# Patient Record
Sex: Female | Born: 1990 | Race: White | Hispanic: No | Marital: Married | Smoking: Never smoker
Health system: Southern US, Community
[De-identification: ages and names within clinical notes are randomized; demographics above are authoritative.]

## PROBLEM LIST (undated history)

## (undated) DIAGNOSIS — R51 Headache: Secondary | ICD-10-CM

## (undated) DIAGNOSIS — G43909 Migraine, unspecified, not intractable, without status migrainosus: Secondary | ICD-10-CM

## (undated) DIAGNOSIS — B019 Varicella without complication: Secondary | ICD-10-CM

## (undated) DIAGNOSIS — F419 Anxiety disorder, unspecified: Secondary | ICD-10-CM

## (undated) DIAGNOSIS — R519 Headache, unspecified: Secondary | ICD-10-CM

## (undated) DIAGNOSIS — E282 Polycystic ovarian syndrome: Secondary | ICD-10-CM

## (undated) HISTORY — DX: Migraine, unspecified, not intractable, without status migrainosus: G43.909

## (undated) HISTORY — DX: Anxiety disorder, unspecified: F41.9

## (undated) HISTORY — DX: Headache, unspecified: R51.9

## (undated) HISTORY — PX: OTHER SURGICAL HISTORY: SHX169

## (undated) HISTORY — DX: Varicella without complication: B01.9

## (undated) HISTORY — DX: Headache: R51

## (undated) HISTORY — PX: KNEE SURGERY: SHX244

---
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
## 2016-04-19 ENCOUNTER — Emergency Department (HOSPITAL_COMMUNITY)
Admission: EM | Admit: 2016-04-19 | Discharge: 2016-04-19 | Disposition: A | Discharge status: Home / Self Care | Payer: Self-pay | Attending: Emergency Medicine | Admitting: Emergency Medicine

## 2016-04-19 ENCOUNTER — Encounter (HOSPITAL_COMMUNITY): Payer: Self-pay

## 2016-04-19 DIAGNOSIS — L0291 Cutaneous abscess, unspecified: Secondary | ICD-10-CM

## 2016-04-19 DIAGNOSIS — N764 Abscess of vulva: Secondary | ICD-10-CM | POA: Insufficient documentation

## 2016-04-19 MED ORDER — CEPHALEXIN 500 MG PO CAPS: 500.0000 mg | ORAL_CAPSULE | Freq: Four times a day (QID) | ORAL | 0 refills | Status: DC

## 2016-04-19 MED ORDER — LIDOCAINE HCL (PF) 1 % IJ SOLN
5.0000 mL | Freq: Once | INTRAMUSCULAR | Status: AC
Start: 2016-04-19 — End: 2016-04-19
  Administered 2016-04-19: 5 mL
  Filled 2016-04-19: qty 5

## 2016-04-19 NOTE — Discharge Instructions (Signed)
Medications: Keflex  Treatment: Take Keflex 4 times daily for 5 days. Make sure to finish all his medication. Leave the dressing applied on until later this evening. Wash the area with warm soapy water, dry, andAnd apply new dressing.  Follow-up: Please return in 2 days for wound recheck. Please return sooner if you develop any in, redness, swelling, streaking from the area. Please follow-up with the [facility] outpatient clinic for further evaluation if your left lower quadrant pain recurs.

## 2016-04-19 NOTE — ED Notes (Signed)
Pt states she understands instructions and will follow up. Home stable with boyfriend and steady gait.

## 2016-04-19 NOTE — ED Triage Notes (Signed)
Patient complains of small hard lump to left groin x 3 days. States that the area tender to touch, no drainage

## 2016-04-19 NOTE — ED Provider Notes (Signed)
[facility]-EMERGENCY DEPT Provider Note   CSN: [id number] Arrival date & time: 04/19/16  4098  By signing my name below, I, Soijett Blue, attest that this documentation has been prepared under the direction and in the presence of Buel Ream, PA-C Electronically Signed: Soijett Blue, ED Scribe. 04/19/16. 1:40 PM.  History   Chief Complaint No chief complaint on file.   HPI Samantha Shea is a 26 y.o. female who presents to the Emergency Department complaining of painful lump to left groin area onset 3 days ago. Pt reports associated redness to affected area. She also reports intermittent LLQ pain x 1 month, which she does not have today. It has been around the same time over her cycle. Pt has tried warm compresses and ice without medications with mild relief of her symptoms. Pt notes that her left groin pain is worsened with ambulation. Pt denies fever, drainage, vaginal discharge, dysuria, constipation, diarrhea, and any other symptoms.      The history is provided by the patient. No language interpreter was used.    History reviewed. No pertinent past medical history.  There are no active problems to display for this patient.   History reviewed. No pertinent surgical history.  OB History    No data available       Home Medications    Prior to Admission medications   Medication Sig Start Date End Date Taking? Authorizing Provider  cephALEXin (KEFLEX) 500 MG capsule Take 1 capsule (500 mg total) by mouth 4 (four) times daily. 04/19/16   Emi Holes, PA-C    Family History No family history on file.  Social History Social History  Substance Use Topics  . Smoking status: Not on file  . Smokeless tobacco: Not on file  . Alcohol use Not on file     Allergies   Codeine   Review of Systems Review of Systems  Constitutional: Negative for fever.  Gastrointestinal: Positive for abdominal pain (LLQ). Negative for constipation and diarrhea.  Genitourinary: Negative  for dysuria and vaginal discharge.  Skin: Positive for color change (redness to affected area).       "lump" to left groin area without drainage.    Physical Exam Updated Vital Signs BP 122/65 (BP Location: Right Arm)   Pulse 64   Temp 98 F (36.7 C) (Oral)   Resp 16   SpO2 100%   Physical Exam  Constitutional: She appears well-developed and well-nourished. No distress.  HENT:  Head: Normocephalic and atraumatic.  Mouth/Throat: Oropharynx is clear and moist. No oropharyngeal exudate.  Eyes: Conjunctivae are normal. Pupils are equal, round, and reactive to light. Right eye exhibits no discharge. Left eye exhibits no discharge. No scleral icterus.  Neck: Normal range of motion. Neck supple. No thyromegaly present.  Cardiovascular: Normal rate, regular rhythm, normal heart sounds and intact distal pulses.  Exam reveals no gallop and no friction rub.   No murmur heard. Pulmonary/Chest: Effort normal and breath sounds normal. No stridor. No respiratory distress. She has no wheezes. She has no rales.  Abdominal: Soft. Bowel sounds are normal. She exhibits no distension. There is no tenderness. There is no rebound and no guarding.  Genitourinary:  Genitourinary Comments: Scribe chaperone present for exam. 3 cm area of redness and induration, medial to left inguinal region.   Musculoskeletal: She exhibits no edema.  Lymphadenopathy:    She has no cervical adenopathy.  Neurological: She is alert. Coordination normal.  Skin: Skin is warm and dry. No rash noted. She  is not diaphoretic. No pallor.  Psychiatric: She has a normal mood and affect.  Nursing note and vitals reviewed.   ED Treatments / Results  DIAGNOSTIC STUDIES: Oxygen Saturation is 99% on RA, nl by my interpretation.    COORDINATION OF CARE: 10:26 AM Discussed treatment plan with pt at bedside which includes bedside US, I&D, abx Rx, and pt agreed to plan.   Procedures .Marland Kitchen.Incision and Drainage Date/Time: 04/19/2016 11:14  AM Performed by: Emi HolesLAW, Caedan Sumler M Authorized by: Emi HolesLAW, Birney Belshe M   Consent:    Consent obtained:  Verbal   Consent given by:  Patient   Risks discussed:  Incomplete drainage, pain and infection   Alternatives discussed:  Alternative treatment Location:    Type:  Abscess   Size:  3 cm   Location:  Anogenital   Anogenital location: medial to left inguinal region. Pre-procedure details:    Skin preparation:  Betadine Anesthesia (see MAR for exact dosages):    Anesthesia method:  Local infiltration   Local anesthetic:  Lidocaine 1% w/o epi (1 cc used) Procedure type:    Complexity:  Simple Procedure details:    Needle aspiration: no     Incision types:  Single straight   Incision depth:  Dermal   Scalpel blade:  11   Wound management:  Probed and deloculated and irrigated with saline   Drainage:  Bloody and purulent   Drainage amount:  Moderate   Wound treatment:  Wound left open   Packing materials:  None Post-procedure details:    Patient tolerance of procedure:  Tolerated well, no immediate complications     (including critical care time)  Medications Ordered in ED Medications  lidocaine (PF) (XYLOCAINE) 1 % injection 5 mL (5 mLs Infiltration Given 04/19/16 1143)     Initial Impression / Assessment and Plan / ED Course  I have reviewed the triage vital signs and the nursing notes.   Patient with skin abscess. Incision and drainage performed in the ED today. Abscess was not large enough to warrant packing or drain placement. Wound recheck in 2 days. Supportive care discussed.  Pt sent home with keflex Rx.  Considering no abdominal pain or tenderness at this time, no further workup today indicated. [facility] possible ovarian cyst.  Strict return precautions given. Patient understands and agrees with plan. Patient vitals stable throughout ED course and discharged in satisfactory condition.  Final Clinical  Impressions(s) / ED Diagnoses   Final diagnoses:  Abscess    New Prescriptions Discharge Medication List as of 04/19/2016 11:37 AM    START taking these medications   Details  cephALEXin (KEFLEX) 500 MG capsule Take 1 capsule (500 mg total) by mouth 4 (four) times daily., Starting Tue 04/19/2016, Print       I personally performed the services described in this documentation, which was scribed in my presence. The recorded information has been reviewed and is accurate.     Emi Holeslexandra M Toinette Lackie, PA-C 04/19/16 1341    Donnetta HutchingBrian Cook, MD 04/21/16 1556

## 2016-06-17 ENCOUNTER — Ambulatory Visit (INDEPENDENT_AMBULATORY_CARE_PROVIDER_SITE_OTHER): Payer: Self-pay | Admitting: Obstetrics & Gynecology

## 2016-06-17 ENCOUNTER — Encounter: Payer: Self-pay | Admitting: Obstetrics & Gynecology

## 2016-06-17 VITALS — BP 134/82 | HR 70 | Ht 66.0 in | Wt 176.8 lb

## 2016-06-17 DIAGNOSIS — R102 Pelvic and perineal pain: Secondary | ICD-10-CM

## 2016-06-17 MED ORDER — LEVONORGEST-ETH ESTRAD 91-DAY 0.1-0.02 & 0.01 MG PO TABS: 1.0000 | ORAL_TABLET | Freq: Every day | ORAL | 4 refills | Status: DC

## 2016-06-17 MED ORDER — IBUPROFEN 800 MG PO TABS: 800.0000 mg | ORAL_TABLET | Freq: Three times a day (TID) | ORAL | 1 refills | Status: DC | PRN

## 2016-06-17 NOTE — Progress Notes (Signed)
   Subjective:    Patient ID: Samantha Shea, female    DOB: 1990-12-08, 26 y.o.   MRN: [id number]  HPI 26 yo SW G0 here with new onset pelvic pain, worse before and during her periods. A week after her period, it is better but then cyclicly gets worse. This started 12/17 and is getting worse. She has tried 400 mg IBU and ice packs. She denies dyspareunia.   Review of Systems Uses condoms She tried OCPs for about 3 months. 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

## 2016-06-20 LAB — GC/CHLAMYDIA PROBE AMP (~~LOC~~) NOT AT ARMC
Chlamydia: NEGATIVE
Neisseria Gonorrhea: NEGATIVE

## 2017-08-15 LAB — HM PAP SMEAR

## 2017-11-22 ENCOUNTER — Emergency Department (HOSPITAL_COMMUNITY): Payer: BC Managed Care – PPO

## 2017-11-22 ENCOUNTER — Encounter (HOSPITAL_COMMUNITY): Payer: Self-pay | Admitting: Emergency Medicine

## 2017-11-22 ENCOUNTER — Emergency Department (HOSPITAL_COMMUNITY)
Admission: EM | Admit: 2017-11-22 | Discharge: 2017-11-23 | Disposition: A | Discharge status: Home / Self Care | Payer: BC Managed Care – PPO | Attending: Emergency Medicine | Admitting: Emergency Medicine

## 2017-11-22 ENCOUNTER — Other Ambulatory Visit: Payer: Self-pay

## 2017-11-22 DIAGNOSIS — R112 Nausea with vomiting, unspecified: Secondary | ICD-10-CM | POA: Diagnosis not present

## 2017-11-22 DIAGNOSIS — Y9389 Activity, other specified: Secondary | ICD-10-CM | POA: Diagnosis not present

## 2017-11-22 DIAGNOSIS — Z79899 Other long term (current) drug therapy: Secondary | ICD-10-CM | POA: Insufficient documentation

## 2017-11-22 DIAGNOSIS — Y999 Unspecified external cause status: Secondary | ICD-10-CM | POA: Diagnosis not present

## 2017-11-22 DIAGNOSIS — S0990XA Unspecified injury of head, initial encounter: Secondary | ICD-10-CM | POA: Diagnosis present

## 2017-11-22 DIAGNOSIS — Y92410 Unspecified street and highway as the place of occurrence of the external cause: Secondary | ICD-10-CM | POA: Diagnosis not present

## 2017-11-22 LAB — BASIC METABOLIC PANEL
Anion gap: 12 (ref 5–15)
BUN: 9 mg/dL (ref 6–20)
CALCIUM: 9.1 mg/dL (ref 8.9–10.3)
CO2: 21 mmol/L — ABNORMAL LOW (ref 22–32)
CREATININE: 0.7 mg/dL (ref 0.44–1.00)
Chloride: 105 mmol/L (ref 98–111)
GFR calc Af Amer: >60 mL/min (ref >60)
GLUCOSE: 118 mg/dL — AB (ref 70–99)
Potassium: 3.8 mmol/L (ref 3.5–5.1)
SODIUM: 138 mmol/L (ref 135–145)

## 2017-11-22 LAB — CBC WITH DIFFERENTIAL/PLATELET
Abs Immature Granulocytes: 0 10*3/uL (ref 0.0–0.1)
BASOS ABS: 0 10*3/uL (ref 0.0–0.1)
BASOS PCT: 0 %
EOS ABS: 0.1 10*3/uL (ref 0.0–0.7)
Eosinophils Relative: 1 %
HCT: 41.2 % (ref 36.0–46.0)
Hemoglobin: 13.9 g/dL (ref 12.0–15.0)
Immature Granulocytes: 0 %
Lymphocytes Relative: 13 %
Lymphs Abs: 1.2 10*3/uL (ref 0.7–4.0)
MCH: 31.4 pg (ref 26.0–34.0)
MCHC: 33.7 g/dL (ref 30.0–36.0)
MCV: 93.2 fL (ref 78.0–100.0)
MONOS PCT: 10 %
Monocytes Absolute: 0.9 10*3/uL (ref 0.1–1.0)
NEUTROS PCT: 76 %
Neutro Abs: 6.9 10*3/uL (ref 1.7–7.7)
PLATELETS: 229 10*3/uL (ref 150–400)
RBC: 4.42 MIL/uL (ref 3.87–5.11)
RDW: 11.9 % (ref 11.5–15.5)
WBC: 9.2 10*3/uL (ref 4.0–10.5)

## 2017-11-22 LAB — I-STAT BETA HCG BLOOD, ED (MC, WL, AP ONLY): I-stat hCG, quantitative: <5 m[IU]/mL (ref <5)

## 2017-11-22 MED ORDER — ONDANSETRON 4 MG PO TBDP
4.0000 mg | ORAL_TABLET | Freq: Once | ORAL | Status: AC
  Administered 2017-11-22: 4 mg via ORAL
  Filled 2017-11-22: qty 1

## 2017-11-22 MED ORDER — MORPHINE SULFATE (PF) 2 MG/ML IV SOLN
2.0000 mg | Freq: Once | INTRAVENOUS | Status: AC
  Administered 2017-11-22: 2 mg via INTRAVENOUS
  Filled 2017-11-22: qty 1

## 2017-11-22 MED ORDER — SODIUM CHLORIDE 0.9 % IV BOLUS
1000.0000 mL | Freq: Once | INTRAVENOUS | Status: AC
  Administered 2017-11-22: 1000 mL via INTRAVENOUS
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
## 2017-11-22 NOTE — ED Notes (Signed)
ED Provider at bedside. 

## 2017-11-23 MED ORDER — KETOROLAC TROMETHAMINE 15 MG/ML IJ SOLN
15.0000 mg | Freq: Once | INTRAMUSCULAR | Status: AC
  Administered 2017-11-23: 15 mg via INTRAVENOUS
  Filled 2017-11-23: qty 1

## 2017-11-23 MED ORDER — ONDANSETRON 4 MG PO TBDP: 4.0000 mg | ORAL_TABLET | Freq: Three times a day (TID) | ORAL | 0 refills | Status: DC | PRN

## 2017-11-23 NOTE — Discharge Instructions (Addendum)
Please return to the Emergency Department for any new or worsening symptoms or if your symptoms do not improve. Please be sure to follow up with your Primary Care Physician as soon as possible regarding your visit today. If you do not have a Primary Doctor please use the resources below to establish one. It is possible that you are experiencing a concussion today.  Please follow-up with the concussion clinic listed on your after visit summary for further evaluation.  Please get plenty of rest and avoid further head injury. You may use the Zofran as prescribed for nausea.  Contact a health care provider if: Your symptoms get worse. You have new symptoms. You continue to have symptoms for more than 2 weeks. Get help right away if: You have severe or worsening headaches. You have weakness or numbness in any part of your body. Your coordination gets worse. You vomit repeatedly. You are sleepier. The pupil of one eye is larger than the other. You have convulsions or a seizure. Your speech is slurred. Your fatigue, confusion, or irritability gets worse. You cannot recognize people or places. You have neck pain. It is difficult to wake you up. You have unusual behavior changes. You lose consciousness.  Do not take your medicine if  develop an itchy rash, swelling in your mouth or lips, or difficulty breathing.   RESOURCE GUIDE  Chronic Pain Problems: Contact [facility]  [phone] Patients need to be referred by their primary care doctor.  Insufficient Money for Medicine: Contact United Way:  call "211" or [facility] [phone].  No Primary Care Doctor: Call [facility] Connect  [phone] - can help you locate a primary care doctor that  accepts your insurance, provides certain services, etc. Physician Referral Service- [phone]  Agencies that provide inexpensive medical care: [facility]  [phone] [facility]   [phone] [facility]  [phone] [facility]  [phone] Planned Parenthood  [phone] [facility]  [phone]  Medicaid-accepting [facility] Providers: [facility]- [address]  [phone], Mon-Fri 9am-7pm, Sat 9am-1pm [facility]- [address] [place], Suite [place]  [phone] [facility]- [address], Suite [place]  [phone] [facility]- [address]  [phone] Renaye Rakers- [address] [place], [address], [phone]  Only accepts [place] patients after they have their name  applied to their card  Self Pay (no insurance) in [facility]: Sickle Cell Patients: Dr Willey Blade, [facility]  [address] [place], [phone] [facility]- [address] [place]  [phone]       [facility] [place]- [address]       -     [facility]- see information above (Speak to [employer] if you do not have insurance)       -  [facility]- [address] [place], [phone]       -  [facility]- [address],  [phone]       -  [facility]- [address] [phone]       -  Dr Julio Sicks-  [address], [address], [place], [phone]       -  [facility]- [address], [phone]       -  [facility] [place]- [address], [phone], also [address], [phone]       -    [facility]- [address] [place], [phone], 1st & 3rd Saturday   every month, 10am-1pm  1) Find a Doctor and Pay Out of Pocket Although you won't have to find out who is covered by your insurance plan, it is a good idea to ask around and get recommendations. You will then need to call the office and see if the doctor you have chosen will accept you as a new patient and what types of options they offer for patients who are self-pay. Some doctors offer  discounts or will set up payment plans for their patients who do not have insurance, but you will need to ask so you aren't surprised when you get to your appointment.  2) Contact Your Local Health Department Not all health departments have doctors that can see patients for sick visits, but many do, so it is worth a call to see if yours does. If you don't know where your local health department is, you can check in your phone book. The CDC also has a tool to help you locate your state's health department, and many state websites also have listings of all of their local health departments.  3) Find a Walk-in Clinic If your illness is not likely to be very severe or complicated, you may want to try a walk in clinic. These are popping up all over the country in pharmacies, drugstores, and shopping centers. They're usually staffed by nurse practitioners or physician assistants that have been trained to treat common illnesses and complaints. They're usually fairly quick and inexpensive. However, if you have serious medical issues or chronic medical problems, these are probably not your best option  STD Testing [facility] of [facility] [place], STD Clinic, [address], [place], phone [phone] or [phone].  Monday - Friday, call for an appointment. [facility] of [facility] [place], STD Clinic, [place], [place], phone [phone] or [phone].  Monday - Friday, call for an appointment.  Abuse/Neglect: [facility] Child Abuse Hotline 9476937852(336) 5071152949 [facility] Child Abuse Hotline [phone] (After Hours)  Emergency Shelter:  [facility] [phone](336) ([phone]  Maternity Homes: [facility] [place] [facility] [phone]) [phone] [facility] [phone]  MRSA Hotline #:   708-226-4359669-238-1646  [facility] Resources  [facility]  United Way [facility]. [address]                 [address]         [address]  Jase Himmelberger                                               [place]                              [place] Phone:  [phone]                                  Phone:  [phone]  Phone:  [place], [place]- [phone]       -     [facility] in [place], [address]                                  [place] [phone] or [phone] (After Hours)   [place]  Substance Abuse Resources: Alcohol and Drug Services  [place] 506-384-2185 [facility] [place] Chinita Pester ([phone] Residential & Outpatient Substance Abuse Program  ([phone]  Psychological Services: [place]  [phone] [place]  [place], [place]. [address] [place], [place]: [phone] or [phone], PicCapture.uy  Dental Assistance  If unable to pay or uninsured, contact:  [facility] or [facility] to become qualified for the adult dental clinic.  Patients with Medicaid: [facility] [phone], [place] [address] [phone]  If unable to pay, or uninsured, contact [facility] [phone]) or [place] [phone] in [place], [place] in [facility]) to become qualified for the adult dental clinic   Other [place], [place], [place], [address], [place], [place], 2nd and 4th Thursday of the month at 6:30am.  10 clients each day by appointment, can sometimes see walk-in patients if someone does not show for an appointment. [facility]- [address] Hillard Danker [place], [place], [address], [place], [place], [place], [address], [place] Department- [phone] [facility] [phone]

## 2017-11-27 ENCOUNTER — Telehealth: Payer: Self-pay
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
## 2017-11-28 ENCOUNTER — Ambulatory Visit: Payer: BC Managed Care – PPO | Admitting: Family Medicine

## 2017-11-28 ENCOUNTER — Encounter: Payer: Self-pay | Admitting: Family Medicine

## 2017-11-28 DIAGNOSIS — S060XAA Concussion with loss of consciousness status unknown, initial encounter: Secondary | ICD-10-CM

## 2017-11-28 DIAGNOSIS — S060X9A Concussion with loss of consciousness of unspecified duration, initial encounter: Secondary | ICD-10-CM | POA: Insufficient documentation

## 2017-11-28 DIAGNOSIS — S060X0A Concussion without loss of consciousness, initial encounter: Secondary | ICD-10-CM | POA: Diagnosis not present

## 2017-11-28 HISTORY — DX: Concussion with loss of consciousness status unknown, initial encounter: S06.0XAA

## 2017-11-28 NOTE — Patient Instructions (Signed)
Good to see you  Concussion  Fish oil 3 grams daily for10 days then 2 grams daily thereafter Vitamin D 2000 IU dialy  CoQ10 200mg  daily until headache is gone.  You should do well  We will se eyou again next week
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
## 2017-11-30 ENCOUNTER — Encounter: Payer: Self-pay | Admitting: Family Medicine

## 2017-11-30 ENCOUNTER — Ambulatory Visit: Payer: BC Managed Care – PPO | Admitting: Family Medicine

## 2017-11-30 VITALS — BP 130/88 | HR 66 | Temp 98.1°F | Ht 65.0 in | Wt 200.2 lb

## 2017-11-30 DIAGNOSIS — Z0000 Encounter for general adult medical examination without abnormal findings: Secondary | ICD-10-CM | POA: Diagnosis not present

## 2017-11-30 DIAGNOSIS — R5383 Other fatigue: Secondary | ICD-10-CM

## 2017-11-30 DIAGNOSIS — F411 Generalized anxiety disorder: Secondary | ICD-10-CM | POA: Diagnosis not present

## 2017-11-30 LAB — LIPID PANEL
CHOL/HDL RATIO: 4
Cholesterol: 149 mg/dL (ref 0–200)
HDL: 41.6 mg/dL (ref >39.00)
LDL CALC: 95 mg/dL (ref 0–99)
NonHDL: 107.03
Triglycerides: 59 mg/dL (ref 0.0–149.0)
VLDL: 11.8 mg/dL (ref 0.0–40.0)

## 2017-11-30 LAB — COMPREHENSIVE METABOLIC PANEL
ALT: 19 U/L (ref 0–35)
AST: 15 U/L (ref 0–37)
Albumin: 4.5 g/dL (ref 3.5–5.2)
Alkaline Phosphatase: 47 U/L (ref 39–117)
BUN: 13 mg/dL (ref 6–23)
CHLORIDE: 105 meq/L (ref 96–112)
CO2: 24 meq/L (ref 19–32)
CREATININE: 0.75 mg/dL (ref 0.40–1.20)
Calcium: 9.5 mg/dL (ref 8.4–10.5)
GFR: 99.37 mL/min (ref >60.00)
Glucose, Bld: 106 mg/dL — ABNORMAL HIGH (ref 70–99)
Potassium: 4.2 mEq/L (ref 3.5–5.1)
Sodium: 137 mEq/L (ref 135–145)
Total Bilirubin: 0.3 mg/dL (ref 0.2–1.2)
Total Protein: 7.8 g/dL (ref 6.0–8.3)

## 2017-11-30 LAB — VITAMIN D 25 HYDROXY (VIT D DEFICIENCY, FRACTURES): VITD: 24.06 ng/mL — AB (ref 30.00–100.00)

## 2017-11-30 LAB — TSH: TSH: 2.76 u[IU]/mL (ref 0.35–4.50)

## 2017-11-30 MED ORDER — HYDROXYZINE HCL 25 MG PO TABS: 25.0000 mg | ORAL_TABLET | Freq: Three times a day (TID) | ORAL | 1 refills | Status: DC | PRN

## 2017-11-30 NOTE — Patient Instructions (Signed)
Health Maintenance Due  Topic Date Due  . HIV Screening -already done in 2018 01/11/2007  . INFLUENZA VACCINE -pt will get this through her employer 10/05/2017
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
## 2017-12-01 ENCOUNTER — Other Ambulatory Visit: Payer: Self-pay | Admitting: Family Medicine

## 2017-12-01 DIAGNOSIS — E559 Vitamin D deficiency, unspecified: Secondary | ICD-10-CM | POA: Insufficient documentation

## 2017-12-01 MED ORDER — VITAMIN D (ERGOCALCIFEROL) 1.25 MG (50000 UNIT) PO CAPS
ORAL_CAPSULE | ORAL | 0 refills | Status: DC
Start: 2017-12-01 — End: 2018-11-08

## 2017-12-04 NOTE — Progress Notes (Signed)
Subjective:   I, Samantha Shea, am serving as a scribe for Dr. Antoine Primas.   Chief Complaint: Samantha Shea, DOB: 10-Jan-1991, is a 27 y.o. female who presents for head injury. She has been out of work for one week. Has headaches daily near the end of the day. Headaches are on left side of head near her eye. Was using Zofran for nausea up until Saturday the 28th. She has discontinued that medication but is still using supplements.    Injury date : 11/22/2017 Visit #: 2  Previous imagine.   History of Present Illness:    Concussion Self-Reported Symptom Score Symptoms rated on a scale 1-6, in last 24 hours  Headache: 3  Nausea: 1  Vomiting: 0  Balance Difficulty: 0   Dizziness: 0  Fatigue: 2  Trouble Falling Asleep: 0  Sleep More Than Usual: 0  Sleep Less Than Usual: 0  Daytime Drowsiness: 4  Photophobia: 3  Phonophobia: 2  Irritability: 0  Sadness: 0  Nervousness: 1  Feeling More Emotional: 0  Numbness or Tingling: 0  Feeling Slowed Down: 2  Feeling Mentally Foggy: 3  Difficulty Concentrating: 1  Difficulty Remembering: 1  Visual Problems: 0    Total Symptom Score: 23 Previous Symptom Score: 30  Review of Systems: Pertinent items are noted in HPI.  Review of History: Past Medical History:  Past Medical History:  Diagnosis Date  . Anxiety   . Chicken pox   . Frequent headaches   . Migraines     Past Surgical History:  has a past surgical history that includes Knee surgery. Family History: family history includes Alcohol abuse in her father; Asthma in her mother; COPD in her mother; Cancer in her maternal grandmother; Drug abuse in her father; Early death in her mother; Heart attack in her maternal grandfather; Heart disease in her mother and paternal grandfather; Hypertension in her father; Miscarriages / [place] in her mother, sister, and sister; Stroke in her paternal grandmother. no family history of autoimmune Social History:  reports that she has  never smoked. She has never used smokeless tobacco. She reports that she drinks alcohol. She reports that she has current or past drug history. Current Medications: has a current medication list which includes the following prescription(s): cholecalciferol, docusate sodium, hydroxyzine, multivitamin, norethindrone-ethinyl estradiol, fish oil, ondansetron, and vitamin d (ergocalciferol). Allergies: has No Known Allergies.  Objective:    Physical Examination Vitals:   12/05/17 0805  BP: 112/86  Pulse: 78  SpO2: 98%   General: No apparent distress alert and oriented x3 mood and affect normal, dressed appropriately.  HEENT: Pupils equal, extraocular movements intact  Respiratory: Patient's speak in full sentences and does not appear short of breath  Cardiovascular: No lower extremity edema, non tender, no erythema  Skin: Warm dry intact with no signs of infection or rash on extremities or on axial skeleton.  Abdomen: Soft nontender  Neuro: Cranial nerves II through XII are intact, neurovascularly intact in all extremities with 2+ DTRs and 2+ pulses.  Lymph: No lymphadenopathy of posterior or anterior cervical chain or axillae bilaterally.  Gait normal with good balance and coordination.  MSK:  Non tender with full range of motion and good stability and symmetric strength and tone of shoulders, elbows, wrist,  knee and ankles bilaterally.  Psychiatric: Oriented X3, intact recent and remote memory, judgement and insight, normal mood and affect  Concussion testing performed today:  I spent 31 minutes with patient discussing test and results including review  of history and patient chart and  integration of patient data, interpretation of standardized test results and clinical data, clinical decision making, treatment planning and report,and interactive feedback to the patient with all of patients questions answered.    Neurocognitive testing (ImPACT):   Post #2   Verbal Memory Composite  84  (46%)   Visual Memory Composite  66(29%)   Visual Motor Speed Composite  42.75 (66%)   Reaction Time Composite  .60 (47%)   Cognitive Efficiency Index  .27       Additional testing performed today: {Improvement with balance testing of 29 out of 30, vestibular neuro seems improved as well.  Serial sevens did very well.   Assessment:    No diagnosis found.  Samantha Shea presents with the following concussion subtypes. [x] Cognitive [] Cervical [] Vestibular [] Ocular [] Migraine [] Anxiety/Mood   Plan:   Action/Discussion: Reviewed diagnosis, management options, expected outcomes, and the reasons for scheduled and emergent follow-up. Questions were adequately answered. Patient expressed verbal understanding and agreement with the following plan.     I was personally involved with the physical evaluation of and am in agreement with the assessment and treatment plan for this patient.  Greater than 50% of this encounter was spent in direct consultation with the patient in evaluation, counseling, and coordination of care. Duration of encounter: 41 minutes.  After Visit Summary printed out and provided to patient as appropriate.

## 2017-12-05 ENCOUNTER — Encounter: Payer: Self-pay | Admitting: Family Medicine

## 2017-12-05 ENCOUNTER — Ambulatory Visit: Payer: BC Managed Care – PPO | Admitting: Family Medicine

## 2017-12-05 DIAGNOSIS — S060X0D Concussion without loss of consciousness, subsequent encounter: Secondary | ICD-10-CM | POA: Diagnosis not present

## 2017-12-05 NOTE — Assessment & Plan Note (Signed)
Significant improvement with testing as well as on physical exam.  Patient symptoms are 30% better.  Discussed icing regimen and home exercises.  Discussed which activities to do which wants to avoid.  Increase activity as tolerated.  Patient will be doing part-time work for the next week and then a full-time trial thereafter.  Patient will see me at the end of the trial and we will discuss full release at that time.  Continue all other treatments.

## 2017-12-05 NOTE — Patient Instructions (Addendum)
Good to see you  Continue the vitamins Part time tomorrow and rest of the week  Caffiene in early AM only  See me again next Friday to hopefully fully release you

## 2017-12-12 NOTE — Progress Notes (Signed)
Subjective:   I, [name], am serving as a scribe for Dr. Antoine Primas.   Chief Complaint: Samantha Shea, DOB: August 19, 1990, is a 27 y.o. female who presents for head injury. She went part-time up until yesterday which was her first full day. Patient feels overstimulated which she describes feeling like an anxiety attack. Patient notes sweating more than usual. Patient is having headaches over the left frontal area. Patient does have a slight headache daily at work. Also feels more irritable.    Injury date : 11/22/2017 Visit #: 3   History of Present Illness:    Concussion Self-Reported Symptom Score Symptoms rated on a scale 1-6, in last 24 hours  Headache:1   Nausea:2  Vomiting: 0  Balance Difficulty:0  Dizziness: 0  Fatigue: 3  Trouble Falling Asleep:0   Sleep More Than Usual: 0  Sleep Less Than Usual: 0  Daytime Drowsiness: 2  Photophobia: 0  Phonophobia: 2  Irritability: 3  Sadness: 1  Nervousness: 2  Feeling More Emotional: 1  Numbness or Tingling: 0  Feeling Slowed Down: 1  Feeling Mentally Foggy: 2  Difficulty Concentrating: 1  Difficulty Remembering: 0  Visual Problems: 0  Total Symptom Score:13   Review of Systems: Pertinent items are noted in HPI.  Review of History: Past Medical History:  Past Medical History:  Diagnosis Date  . Anxiety   . Chicken pox   . Frequent headaches   . Migraines     Past Surgical History:  has a past surgical history that includes Knee surgery. Family History: family history includes Alcohol abuse in her father; Asthma in her mother; COPD in her mother; Cancer in her maternal grandmother; Drug abuse in her father; Early death in her mother; Heart attack in her maternal grandfather; Heart disease in her mother and paternal grandfather; Hypertension in her father; Miscarriages / [place] in her mother, sister, and sister; Stroke in her paternal grandmother. no family history of autoimmune Social History:  reports that  she has never smoked. She has never used smokeless tobacco. She reports that she drinks alcohol. She reports that she has current or past drug history. Current Medications: has a current medication list which includes the following prescription(s): cholecalciferol, docusate sodium, hydroxyzine, multivitamin, norethindrone-ethinyl estradiol, fish oil, ondansetron, and vitamin d (ergocalciferol). Allergies: has No Known Allergies.  Objective:    Physical Examination Vitals:   12/15/17 0759  BP: 110/80  Pulse: 87  SpO2: 98%   General: No apparent distress alert and oriented x3 mood and affect normal, dressed appropriately.  HEENT: Pupils equal, extraocular movements intact  Respiratory: Patient's speak in full sentences and does not appear short of breath  Cardiovascular: No lower extremity edema, non tender, no erythema  Skin: Warm dry intact with no signs of infection or rash on extremities or on axial skeleton.  Abdomen: Soft nontender  Neuro: Cranial nerves II through XII are intact, neurovascularly intact in all extremities with 2+ DTRs and 2+ pulses.  Lymph: No lymphadenopathy of posterior or anterior cervical chain or axillae bilaterally.  Gait normal with good balance and coordination.  MSK:  Non tender with full range of motion and good stability and symmetric strength and tone of shoulders, elbows, wrist,  knee and ankles bilaterally.  Psychiatric: Oriented X3, intact recent and remote memory, judgement and insight, normal mood and affect Balance Screen: 30/30     Assessment:    GAD (generalized anxiety disorder)  Concussion without loss of consciousness, subsequent encounter  [employer]  presents with the following concussion subtypes. [] Cognitive [] Cervical [] Vestibular [] Ocular [] Migraine [x] Anxiety/Mood

## 2017-12-15 ENCOUNTER — Ambulatory Visit (INDEPENDENT_AMBULATORY_CARE_PROVIDER_SITE_OTHER): Payer: BC Managed Care – PPO | Admitting: Family Medicine

## 2017-12-15 ENCOUNTER — Encounter: Payer: Self-pay | Admitting: Family Medicine

## 2017-12-15 VITALS — BP 110/80 | HR 87 | Ht 65.0 in | Wt 200.0 lb

## 2017-12-15 DIAGNOSIS — F411 Generalized anxiety disorder: Secondary | ICD-10-CM

## 2017-12-15 DIAGNOSIS — S060X0D Concussion without loss of consciousness, subsequent encounter: Secondary | ICD-10-CM | POA: Diagnosis not present

## 2017-12-15 NOTE — Assessment & Plan Note (Signed)
Patient seems to be doing better.  I do believe that patient has an underlying generalized anxiety disorder that is being amplified secondary to the concussion.  Concussion is near completely resolved but patient continues to have more of the anxiety condition.  We discussed with patient at great length.  Patient was recently given hydroxyzine by primary care provider that I think will be beneficial.  I do not think that any underlying depression.  Patient will discuss with primary care provider as well.  Patient will be following up with me again in 2 to 3 weeks if not completely resolved.

## 2017-12-15 NOTE — Patient Instructions (Addendum)
Good to see you  write me every week  Take hydroxyzine at home first and then if not drowsy take to start day at school to help with irritability  Continue the vitamins See me again in 3 weeks if not better

## 2017-12-22 ENCOUNTER — Encounter: Payer: Self-pay | Admitting: Family Medicine

## 2018-01-05 NOTE — Progress Notes (Signed)
Subjective:   I, [name], am serving as a scribe for Dr. Antoine Primas.   Chief Complaint: Parks Ranger, DOB: [birth year], is a 27 y.o. female who presents for head injury. She has been working full time and has not had any post concussive symptoms since last visit. She feels as if she is at 100%.   Injury date : 11/22/2017 Visit #: 3   History of Present Illness:    Concussion Self-Reported Symptom Score Symptoms rated on a scale 1-6, in last 24 hours All symptoms are at a zero today.  Total Symptom Score: 0 Previous Symptom Score: 13  Review of Systems: Pertinent items are noted in HPI.  Review of History: Past Medical History: Past Medical History:  Diagnosis Date  . Anxiety   . Chicken pox   . Frequent headaches   . Migraines     Past Surgical History:  has a past surgical history that includes Knee surgery. Family History: family history includes Alcohol abuse in her father; Asthma in her mother; COPD in her mother; Cancer in her maternal grandmother; Drug abuse in her father; Early death in her mother; Heart attack in her maternal grandfather; Heart disease in her mother and paternal grandfather; Hypertension in her father; Miscarriages / [place] in her mother, sister, and sister; Stroke in her paternal grandmother. no family history of autoimmune Social History:  reports that she has never smoked. She has never used smokeless tobacco. She reports that she drinks alcohol. She reports that she has current or past drug history. Current Medications: has a current medication list which includes the following prescription(s): cholecalciferol, docusate sodium, hydroxyzine, multivitamin, norethindrone-ethinyl estradiol, fish oil, ondansetron, and vitamin d (ergocalciferol). Allergies: has No Known Allergies.  Objective:    Physical Examination Vitals:   01/08/18 0758  BP: 108/82  Pulse: 82  SpO2: 98%   General: No apparent distress alert and oriented x3 mood and  affect normal, dressed appropriately.  HEENT: Pupils equal, extraocular movements intact  Respiratory: Patient's speak in full sentences and does not appear short of breath  Cardiovascular: No lower extremity edema, non tender, no erythema  Skin: Warm dry intact with no signs of infection or rash on extremities or on axial skeleton.  Abdomen: Soft nontender  Neuro: Cranial nerves II through XII are intact, neurovascularly intact in all extremities with 2+ DTRs and 2+ pulses.  Lymph: No lymphadenopathy of posterior or anterior cervical chain or axillae bilaterally.  Gait normal with good balance and coordination.  MSK:  Non tender with full range of motion and good stability and symmetric strength and tone of shoulders, elbows, wrist,  knee and ankles bilaterally.  Psychiatric: Oriented X3, intact recent and remote memory, judgement and insight, normal mood and affect  Concussion testing performed today: none

## 2018-01-08 ENCOUNTER — Ambulatory Visit (INDEPENDENT_AMBULATORY_CARE_PROVIDER_SITE_OTHER): Payer: BC Managed Care – PPO | Admitting: Family Medicine

## 2018-01-08 ENCOUNTER — Encounter: Payer: Self-pay | Admitting: Family Medicine

## 2018-01-08 DIAGNOSIS — S060X0D Concussion without loss of consciousness, subsequent encounter: Secondary | ICD-10-CM | POA: Diagnosis not present

## 2018-01-08 NOTE — Assessment & Plan Note (Signed)
Resolved, f/u as needed.  

## 2018-01-10 ENCOUNTER — Encounter: Payer: Self-pay | Admitting: Family Medicine

## 2018-01-10 ENCOUNTER — Ambulatory Visit: Payer: BC Managed Care – PPO | Admitting: Family Medicine

## 2018-01-10 VITALS — BP 116/80 | HR 69 | Temp 97.7°F | Wt 206.2 lb

## 2018-01-10 DIAGNOSIS — F419 Anxiety disorder, unspecified: Secondary | ICD-10-CM

## 2018-01-10 DIAGNOSIS — R7309 Other abnormal glucose: Secondary | ICD-10-CM | POA: Diagnosis not present

## 2018-01-10 LAB — HEMOGLOBIN A1C: Hgb A1c MFr Bld: 5.3 % (ref 4.6–6.5)

## 2018-01-10 NOTE — Progress Notes (Signed)
Patient: Samantha Shea MRN: [id number] DOB: 03-31-1990 PCP: Orland Mustard, MD     Subjective:  Chief Complaint  Patient presents with  . Anxiety    follow up    HPI: The patient is a 27 y.o. female who presents today for anxiety follow up. I saw her about a month ago and her anxiety was severe;however, she felt like her concussion was making everything worse. Despite having a high GAD-7 score, she wanted to hold off on medication. We gave her prn hydroxyzine with close f/u to see if daily medication needed to be added. She states the medication is really helping her and she likes it. She uses it daily, but usually once/day. No side effects from medication and feels like she is well controlled. She feels like she is doing much better as her concussion has fully resolved. Work is going well too.    Elevated blood sugar: needs a1c. Could not add on to labs at last visit.   Review of Systems  Respiratory: Negative for shortness of breath.   Cardiovascular: Negative for chest pain.  Gastrointestinal: Negative for abdominal pain and nausea.  Neurological: Negative for dizziness and headaches.  Psychiatric/Behavioral: Negative for dysphoric mood. The patient is nervous/anxious.     Allergies Patient has No Known Allergies.  Past Medical History Patient  has a past medical history of Anxiety, Chicken pox, Frequent headaches, and Migraines.  Surgical History Patient  has a past surgical history that includes Knee surgery.  Family History Pateint's family history includes Alcohol abuse in her father; Asthma in her mother; COPD in her mother; Cancer in her maternal grandmother; Drug abuse in her father; Shea death in her mother; Heart attack in her maternal grandfather; Heart disease in her mother and paternal grandfather; Hypertension in her father; Miscarriages / [place] in her mother, sister, and sister; Stroke in her paternal grandmother.  Social History Patient  reports that  she has never smoked. She has never used smokeless tobacco. She reports that she drinks alcohol. She reports that she has current or past drug history.    Objective: Vitals:   01/10/18 0811  BP: 116/80  Pulse: 69  Temp: 97.7 F (36.5 C)  TempSrc: Oral  SpO2: 97%  Weight: 206 lb 3.2 oz (93.5 kg)    Body mass index is 34.31 kg/m.  Physical Exam  Constitutional: She is oriented to person, place, and time. She appears well-developed and well-nourished.  HENT:  Right Ear: External ear normal.  Left Ear: External ear normal.  Mouth/Throat: Oropharynx is clear and moist.  Neck: Normal range of motion. Neck supple.  Cardiovascular: Normal rate, regular rhythm and normal heart sounds.  Pulmonary/Chest: Effort normal and breath sounds normal.  Abdominal: Soft. Bowel sounds are normal.  Neurological: She is alert and oriented to person, place, and time.  Psychiatric: She has a normal mood and affect. Her behavior is normal.  Vitals reviewed.      GAD 7 : Generalized Anxiety Score 01/10/2018 11/30/2017 06/17/2016  Nervous, Anxious, on Edge 2 3 1   Control/stop worrying 1 3 1   Worry too much - different things 1 3 1   Trouble relaxing 2 2 0  Restless 0 2 0  Easily annoyed or irritable 2 2 0  Afraid - awful might happen 2 3 0  Total GAD 7 Score 10 18 3   Anxiety Difficulty Somewhat difficult - -     Assessment/plan: 1. Anxiety Doing much, much better. Likely combo of healed concussion and medication. She  really likes hydroxyzine and would like to continue this. Feels like anxiety is controlled. gad7 significantly improved. No refills needed at this time. F/u in 12 months or as needed.   2. Abnormal blood sugar  - Hemoglobin A1c      Return if symptoms worsen or fail to improve.    Orland Mustard, MD [facility]   01/10/2018

## 2018-01-23 ENCOUNTER — Other Ambulatory Visit: Payer: Self-pay | Admitting: Family Medicine

## 2018-05-28 ENCOUNTER — Telehealth: Payer: BC Managed Care – PPO | Admitting: Physician Assistant

## 2018-05-28 DIAGNOSIS — B349 Viral infection, unspecified: Secondary | ICD-10-CM

## 2018-05-28 DIAGNOSIS — R509 Fever, unspecified: Secondary | ICD-10-CM

## 2018-05-28 NOTE — Progress Notes (Signed)
I have spent 5 minutes in review of e-visit questionnaire, review and updating patient chart, medical decision making and response to patient.   Yisell Sprunger Cody Faun Mcqueen, PA-C    

## 2018-05-28 NOTE — Progress Notes (Signed)
E-Visit for Corona Virus Screening  Based on your current symptoms, you may very well have the virus, however your symptoms are mild. Currently, not all patients are being tested. If the symptoms are mild and there is not a known exposure, performing the test is not indicated.  Coronavirus disease 2019 (COVID-19) is a respiratory illness that can spread from person to person. The virus that causes COVID-19 is a new virus that was first identified in the country of [place] but is now found in multiple other countries and has spread to the [place].  Symptoms associated with the virus are mild to severe fever, cough, and shortness of breath. There is currently no vaccine to protect against COVID-19, and there is no specific antiviral treatment for the virus.   To be considered HIGH RISK for Coronavirus (COVID-19), you have to meet the following criteria:  . Traveled to [place], [place], [place], [place] or [place]; or in the [place] to [place], [place], [place], or [place]; and have fever, cough, and shortness of breath within the last 2 weeks of travel OR  . Been in close contact with a person diagnosed with COVID-19 within the last 2 weeks and have fever, cough, and shortness of breath  . IF YOU DO NOT MEET THESE CRITERIA, YOU ARE CONSIDERED LOW RISK FOR COVID-19.   It is vitally important that if you feel that you have an infection such as this virus or any other virus that you stay home and away from places where you may spread it to others.  You should self-quarantine for 14 days if you have symptoms that could potentially be coronavirus and avoid contact with people age 65 and older.   You can use medication such as Delsym or Mucinex-DM for cough and congestion.  You may also take acetaminophen (Tylenol) as needed for fever.   Reduce your risk of any infection by using the same precautions used for avoiding the common cold or flu:  . Wash your hands often with soap and warm  water for at least 20 seconds.  If soap and water are not readily available, use an alcohol-based hand sanitizer with at least 60% alcohol.  . If coughing or sneezing, cover your mouth and nose by coughing or sneezing into the elbow areas of your shirt or coat, into a tissue or into your sleeve (not your hands). . Avoid shaking hands with others and consider head nods or verbal greetings only. . Avoid touching your eyes, nose, or mouth with unwashed hands.  . Avoid close contact with people who are sick. . Avoid places or events with large numbers of people in one location, like concerts or sporting events. . Carefully consider travel plans you have or are making. . If you are planning any travel outside or inside the [place], visit the CDC's Travelers' Health webpage for the latest health notices. . If you have some symptoms but not all symptoms, continue to monitor at home and seek medical attention if your symptoms worsen. . If you are having a medical emergency, call 911.  HOME CARE . Only take medications as instructed by your medical team. . Drink plenty of fluids and get plenty of rest. . A steam or ultrasonic humidifier can help if you have congestion.   GET HELP RIGHT AWAY IF: . You develop worsening fever. . You become short of breath . You cough up blood. . Your symptoms become more severe MAKE SURE YOU   Understand   these instructions.  Will watch your condition.  Will get help right away if you are not doing well or get worse.  Your e-visit answers were reviewed by a board certified advanced clinical practitioner to complete your personal care plan.  Depending on the condition, your plan could have included both over the counter or prescription medications.  If there is a problem please reply once you have received a response from your provider. Your safety is important to us.  If you have drug allergies check your prescription carefully.    You can use MyChart to ask questions  about today's visit, request a non-urgent call back, or ask for a work or school excuse for 24 hours related to this e-Visit. If it has been greater than 24 hours you will need to follow up with your provider, or enter a new e-Visit to address those concerns. You will get an e-mail in the next two days asking about your experience.  I hope that your e-visit has been valuable and will speed your recovery. Thank you for using e-visits.    

## 2018-11-07 ENCOUNTER — Encounter: Payer: Self-pay | Admitting: Family Medicine

## 2018-11-08 ENCOUNTER — Ambulatory Visit (INDEPENDENT_AMBULATORY_CARE_PROVIDER_SITE_OTHER): Payer: BC Managed Care – PPO | Admitting: Family Medicine

## 2018-11-08 ENCOUNTER — Encounter: Payer: Self-pay | Admitting: Family Medicine

## 2018-11-08 VITALS — Temp 97.7°F | Ht 65.0 in | Wt 206.0 lb

## 2018-11-08 DIAGNOSIS — F411 Generalized anxiety disorder: Secondary | ICD-10-CM

## 2018-11-08 MED ORDER — FLUOXETINE HCL 20 MG PO CAPS: 20.0000 mg | ORAL_CAPSULE | Freq: Every day | ORAL | 1 refills | Status: DC

## 2018-11-08 MED ORDER — HYDROXYZINE HCL 25 MG PO TABS: 25.0000 mg | ORAL_TABLET | Freq: Three times a day (TID) | ORAL | 1 refills | Status: DC | PRN

## 2018-11-08 NOTE — Progress Notes (Signed)
Patient: Samantha Shea MRN: [id number] DOB: [birth year] PCP: Orma Flaming, MD     I connected with Samantha Shea on 11/08/18 at 1:40pm by a video enabled telemedicine application and verified that I am speaking with the correct person using two identifiers.  Location patient: Home Location provider: [facility], Office Persons participating in this virtual visit: Gay Filler and Dr. Rogers Blocker   I discussed the limitations of evaluation and management by telemedicine and the availability of in person appointments. The patient expressed understanding and agreed to proceed.   Subjective:  Chief Complaint  Patient presents with  . discuss anxiety meds    HPI: The patient is a 28 y.o. female who presents today for anxiety. WE have discussed this in the past and she was doing good with prn hydroxyzine. She feels like a few weeks ago things just started to unwind. Work is constant stressor for her and she feels overwhelmed all of the time. She can never relax at home or turn her head off. She started to have problems falling asleep about one week ago as well. Home life is good, no stressors there except for planning a wedding. She was exercising well during covid, but is back at work now. She is also interested in counseling. Denies any si/hi/ah/vh. She is not having panic attacks, but does get chest tightness when really anxious. She had a co worker tell her that she needed to call her doctor because her anxiety is getting out of control. This made her call today.   Review of Systems  Constitutional: Positive for fatigue. Negative for chills and fever.  HENT: Positive for congestion. Negative for postnasal drip and rhinorrhea.   Eyes: Negative for visual disturbance.  Respiratory: Positive for chest tightness. Negative for cough and shortness of breath.   Cardiovascular: Negative for chest pain, palpitations and leg swelling.  Gastrointestinal: Negative for abdominal pain, diarrhea, nausea and  vomiting.  Endocrine: Negative for polydipsia and polyuria.  Genitourinary: Positive for dysuria. Negative for frequency.  Musculoskeletal: Negative for arthralgias, back pain, myalgias and neck pain.  Skin: Negative for rash.  Neurological: Negative for dizziness and headaches.  Psychiatric/Behavioral: Positive for sleep disturbance. The patient is nervous/anxious.     Allergies Patient has No Known Allergies.  Past Medical History Patient  has a past medical history of Anxiety, Chicken pox, Frequent headaches, and Migraines.  Surgical History Patient  has a past surgical history that includes Knee surgery.  Family History Pateint's family history includes Alcohol abuse in her father; Asthma in her mother; COPD in her mother; Cancer in her maternal grandmother; Drug abuse in her father; Early death in her mother; Heart attack in her maternal grandfather; Heart disease in her mother and paternal grandfather; Hypertension in her father; Miscarriages / [place] in her mother, sister, and sister; Stroke in her paternal grandmother.  Social History Patient  reports that she has never smoked. She has never used smokeless tobacco. She reports current alcohol use. She reports current drug use.    Objective: Vitals:   11/08/18 1323  Temp: 97.7 F (36.5 C)  TempSrc: Oral  Weight: 206 lb (93.4 kg)  Height: 5\' 5"  (1.651 m)    Body mass index is 34.28 kg/m.  Physical Exam Vitals signs reviewed.  Constitutional:      Appearance: Normal appearance. She is obese.  HENT:     Head: Normocephalic and atraumatic.  Pulmonary:     Effort: Pulmonary effort is normal.  Neurological:  General: No focal deficit present.     Mental Status: She is alert and oriented to person, place, and time.  Psychiatric:     Comments: Tearful during appointment. No si/hi        GAD 7 : Generalized Anxiety Score 11/08/2018 01/10/2018 11/30/2017 06/17/2016  Nervous, Anxious, on Edge 3 2 3 1    Control/stop worrying 3 1 3 1   Worry too much - different things 3 1 3 1   Trouble relaxing 3 2 2  0  Restless 2 0 2 0  Easily annoyed or irritable 2 2 2  0  Afraid - awful might happen 3 2 3  0  Total GAD 7 Score 19 10 18 3   Anxiety Difficulty Somewhat difficult Somewhat difficult - -    [facility] 11/08/2018 11/30/2017 11/30/2017 06/17/2016  Decreased Interest 1 1 0 1  Down, Depressed, Hopeless 1 0 0 1  PHQ - 2 Score 2 1 0 2  Altered sleeping 3 1 - 1  Tired, decreased energy 2 1 - 1  Change in appetite 2 1 - 0  Feeling bad or failure about yourself  2 1 - 1  Trouble concentrating 2 0 - 0  Moving slowly or fidgety/restless 2 1 - 0  Suicidal thoughts 0 0 - 0  PHQ-9 Score 15 6 - 5  Difficult doing work/chores Not difficult at all Not difficult at all - -    Assessment/plan: 1. GAD (generalized anxiety disorder) Starting her on prozac daily and will continue hydroxyzine prn. Discussed her phq9 score is elevated as well, but her GAD7 score is quite severe. Daily medication is appropriate at this point. Also want her exercising more and she is going to really look into starting counseling. Sleep hygiene at night and hopefully treating anxiety will help her sleep better. I've explained to her that drugs of the SSRI class can have side effects such as weight gain, sexual dysfunction, insomnia, headache, nausea. These medications are generally effective at alleviating symptoms of anxiety and/or depression. Let me know if significant side effects do occur. Will have her f/u in one month. Any SI/HI she is to call 911 or go to er.     Return in about 1 month (around 12/08/2018) for anxiety/sleep .     Orland MustardAllison Keandrea Tapley, MD [facility]  11/08/2018

## 2019-01-11 ENCOUNTER — Other Ambulatory Visit: Payer: Self-pay | Admitting: Family Medicine

## 2019-01-11 NOTE — Telephone Encounter (Signed)
Overdue for appointment needs to be seen. Sent in one month supply only.  Samantha Flaming, MD [place]

## 2019-01-11 NOTE — Telephone Encounter (Signed)
Left vm message requesting a c/b from patient. 

## 2019-01-11 NOTE — Telephone Encounter (Signed)
Last OV 11/08/18 Last refill 11/08/18 #30/1 Next OV not scheduled  Forwarding to Dr. Rogers Blocker

## 2019-01-23 ENCOUNTER — Other Ambulatory Visit: Payer: Self-pay

## 2019-01-23 ENCOUNTER — Encounter: Payer: Self-pay | Admitting: Family Medicine

## 2019-01-23 ENCOUNTER — Ambulatory Visit (INDEPENDENT_AMBULATORY_CARE_PROVIDER_SITE_OTHER): Payer: BC Managed Care – PPO | Admitting: Family Medicine

## 2019-01-23 VITALS — BP 100/84 | HR 82 | Temp 98.4°F | Ht 65.0 in | Wt 222.4 lb

## 2019-01-23 DIAGNOSIS — E559 Vitamin D deficiency, unspecified: Secondary | ICD-10-CM

## 2019-01-23 DIAGNOSIS — Z0000 Encounter for general adult medical examination without abnormal findings: Secondary | ICD-10-CM | POA: Diagnosis not present

## 2019-01-23 DIAGNOSIS — F411 Generalized anxiety disorder: Secondary | ICD-10-CM

## 2019-01-23 DIAGNOSIS — Z114 Encounter for screening for human immunodeficiency virus [HIV]: Secondary | ICD-10-CM | POA: Diagnosis not present

## 2019-01-23 LAB — LIPID PANEL
Cholesterol: 176 mg/dL (ref 0–200)
HDL: 40 mg/dL (ref >39.00)
LDL Cholesterol: 112 mg/dL — ABNORMAL HIGH (ref 0–99)
NonHDL: 136.49
Total CHOL/HDL Ratio: 4
Triglycerides: 124 mg/dL (ref 0.0–149.0)
VLDL: 24.8 mg/dL (ref 0.0–40.0)

## 2019-01-23 LAB — CBC WITH DIFFERENTIAL/PLATELET
Basophils Absolute: 0 10*3/uL (ref 0.0–0.1)
Basophils Relative: 0.6 % (ref 0.0–3.0)
Eosinophils Absolute: 0.5 10*3/uL (ref 0.0–0.7)
Eosinophils Relative: 6.7 % — ABNORMAL HIGH (ref 0.0–5.0)
HCT: 44.4 % (ref 36.0–46.0)
Hemoglobin: 15 g/dL (ref 12.0–15.0)
Lymphocytes Relative: 21.2 % (ref 12.0–46.0)
Lymphs Abs: 1.7 10*3/uL (ref 0.7–4.0)
MCHC: 33.8 g/dL (ref 30.0–36.0)
MCV: 93.9 fl (ref 78.0–100.0)
Monocytes Absolute: 0.6 10*3/uL (ref 0.1–1.0)
Monocytes Relative: 7.6 % (ref 3.0–12.0)
Neutro Abs: 5 10*3/uL (ref 1.4–7.7)
Neutrophils Relative %: 63.9 % (ref 43.0–77.0)
Platelets: 216 10*3/uL (ref 150.0–400.0)
RBC: 4.73 Mil/uL (ref 3.87–5.11)
RDW: 13.2 % (ref 11.5–15.5)
WBC: 7.9 10*3/uL (ref 4.0–10.5)

## 2019-01-23 LAB — COMPREHENSIVE METABOLIC PANEL
ALT: 23 U/L (ref 0–35)
AST: 16 U/L (ref 0–37)
Albumin: 4.5 g/dL (ref 3.5–5.2)
Alkaline Phosphatase: 60 U/L (ref 39–117)
BUN: 11 mg/dL (ref 6–23)
CO2: 25 mEq/L (ref 19–32)
Calcium: 9.1 mg/dL (ref 8.4–10.5)
Chloride: 102 mEq/L (ref 96–112)
Creatinine, Ser: 0.81 mg/dL (ref 0.40–1.20)
GFR: 84.8 mL/min (ref >60.00)
Glucose, Bld: 75 mg/dL (ref 70–99)
Potassium: 3.9 mEq/L (ref 3.5–5.1)
Sodium: 137 mEq/L (ref 135–145)
Total Bilirubin: 0.4 mg/dL (ref 0.2–1.2)
Total Protein: 7.2 g/dL (ref 6.0–8.3)

## 2019-01-23 LAB — VITAMIN D 25 HYDROXY (VIT D DEFICIENCY, FRACTURES): VITD: 16.57 ng/mL — ABNORMAL LOW (ref 30.00–100.00)

## 2019-01-23 LAB — TSH: TSH: 2.89 u[IU]/mL (ref 0.35–4.50)

## 2019-01-23 MED ORDER — FLUTICASONE PROPIONATE 50 MCG/ACT NA SUSP: 2.0000 | Freq: Every day | NASAL | 6 refills | Status: DC

## 2019-01-23 MED ORDER — FLUOXETINE HCL 20 MG PO CAPS: 20.0000 mg | ORAL_CAPSULE | Freq: Every day | ORAL | 3 refills | Status: DC

## 2019-01-23 NOTE — Patient Instructions (Signed)
Preventive Care 21-27 Years Old, Female Preventive care refers to visits with your health care provider and lifestyle choices that can promote health and wellness. This includes:  A yearly physical exam. This may also be called an annual well check.  Regular dental visits and eye exams.  Immunizations.  Screening for certain conditions.  Healthy lifestyle choices, such as eating a healthy diet, getting regular exercise, not using drugs or products that contain nicotine and tobacco, and limiting alcohol use. What can I expect for my preventive care visit? Physical exam Your health care provider will check your:  Height and weight. This may be used to calculate body mass index (BMI), which tells if you are at a healthy weight.  Heart rate and blood pressure.  Skin for abnormal spots. Counseling Your health care provider may ask you questions about your:  Alcohol, tobacco, and drug use.  Emotional well-being.  Home and relationship well-being.  Sexual activity.  Eating habits.  Work and work environment.  Method of birth control.  Menstrual cycle.  Pregnancy history. What immunizations do I need?  Influenza (flu) vaccine  This is recommended every year. Tetanus, diphtheria, and pertussis (Tdap) vaccine  You may need a Td booster every 10 years. Varicella (chickenpox) vaccine  You may need this if you have not been vaccinated. Human papillomavirus (HPV) vaccine  If recommended by your health care provider, you may need three doses over 6 months. Measles, mumps, and rubella (MMR) vaccine  You may need at least one dose of MMR. You may also need a second dose. Meningococcal conjugate (MenACWY) vaccine  One dose is recommended if you are age 19-21 years and a first-year college student living in a residence hall, or if you have one of several medical conditions. You may also need additional booster doses. Pneumococcal conjugate (PCV13) vaccine  You may need  this if you have certain conditions and were not previously vaccinated. Pneumococcal polysaccharide (PPSV23) vaccine  You may need one or two doses if you smoke cigarettes or if you have certain conditions. Hepatitis A vaccine  You may need this if you have certain conditions or if you travel or work in places where you may be exposed to hepatitis A. Hepatitis B vaccine  You may need this if you have certain conditions or if you travel or work in places where you may be exposed to hepatitis B. Haemophilus influenzae type b (Hib) vaccine  You may need this if you have certain conditions. You may receive vaccines as individual doses or as more than one vaccine together in one shot (combination vaccines). Talk with your health care provider about the risks and benefits of combination vaccines. What tests do I need?  Blood tests  Lipid and cholesterol levels. These may be checked every 5 years starting at age 20.  Hepatitis C test.  Hepatitis B test. Screening  Diabetes screening. This is done by checking your blood sugar (glucose) after you have not eaten for a while (fasting).  Sexually transmitted disease (STD) testing.  BRCA-related cancer screening. This may be done if you have a family history of breast, ovarian, tubal, or peritoneal cancers.  Pelvic exam and Pap test. This may be done every 3 years starting at age 21. Starting at age 30, this may be done every 5 years if you have a Pap test in combination with an HPV test. Talk with your health care provider about your test results, treatment options, and if necessary, the need for more tests.   Follow these instructions at home: Eating and drinking   Eat a diet that includes fresh fruits and vegetables, whole grains, lean protein, and low-fat dairy.  Take vitamin and mineral supplements as recommended by your health care provider.  Do not drink alcohol if: ? Your health care provider tells you not to drink. ? You are  pregnant, may be pregnant, or are planning to become pregnant.  If you drink alcohol: ? Limit how much you have to 0-1 drink a day. ? Be aware of how much alcohol is in your drink. In the [place] one drink equals one 12 oz bottle of beer (355 mL), one 5 oz glass of wine (148 mL), or one 1 oz glass of hard liquor (44 mL). Lifestyle  Take daily care of your teeth and gums.  Stay active. Exercise for at least 30 minutes on 5 or more days each week.  Do not use any products that contain nicotine or tobacco, such as cigarettes, e-cigarettes, and chewing tobacco. If you need help quitting, ask your health care provider.  If you are sexually active, practice safe sex. Use a condom or other form of birth control (contraception) in order to prevent pregnancy and STIs (sexually transmitted infections). If you plan to become pregnant, see your health care provider for a preconception visit. What's next?  Visit your health care provider once a year for a well check visit.  Ask your health care provider how often you should have your eyes and teeth checked.  Stay up to date on all vaccines. This information is not intended to replace advice given to you by your health care provider. Make sure you discuss any questions you have with your health care provider. Document Released: 04/19/2001 Document Revised: 11/02/2017 Document Reviewed: 11/02/2017 Elsevier Patient Education  2020 Elsevier Inc.  

## 2019-01-23 NOTE — Progress Notes (Signed)
Patient: Samantha Shea MRN: [id number] DOB: [birth year] PCP: Orma Flaming, MD     Subjective:  Chief Complaint  Patient presents with  . Medication Refill  . Anxiety    HPI: The patient is a 28 y.o. female who presents today for anxiety follow up. We started her on prozac daily at the last visit. Her sleep has improved drastically since starting the medication and she wonders if this has helped. 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 She is doing yoga and exercising as well. I gave her hydroxyzine as well, but she has not needed this at all. She has had no panic attacks at all. She is very happy with the medication. She has increased motivation and is working out.   Annual physical exam: The patient is a 28 year old female who presents today for annual exam. She denies any changes to past medical history. There have been no recent hospitalizations. They are following a well balanced diet and exercise plan. Weight has been decreasing steadily.    -declines flu shot.  utd on tdap  Pap smear: 08/15/2017   Review of Systems  Constitutional: Negative for chills, fatigue and fever.  HENT: Negative for dental problem, ear pain, hearing loss and trouble swallowing.   Eyes: Negative for visual disturbance.  Respiratory: Negative for cough, chest tightness and shortness of breath.   Cardiovascular: Negative for chest pain, palpitations and leg swelling.  Gastrointestinal: Negative for abdominal pain, blood in stool, diarrhea, nausea and vomiting.  Endocrine: Negative for cold intolerance, polydipsia, polyphagia and polyuria.  Genitourinary: Negative for dysuria and hematuria.  Musculoskeletal: Negative for arthralgias.  Skin: Negative for rash.  Neurological: Negative for dizziness and headaches.  Psychiatric/Behavioral: Negative for dysphoric mood and sleep disturbance. The patient is not nervous/anxious.     Allergies Patient has No Known Allergies.  Past  Medical History Patient  has a past medical history of Anxiety, Chicken pox, Frequent headaches, and Migraines.  Surgical History Patient  has a past surgical history that includes Knee surgery and gum graft (09/2018 01/2019).  Family History Pateint's family history includes Alcohol abuse in her father; Asthma in her mother; COPD in her mother; Cancer in her maternal grandmother; Drug abuse in her father; Early death in her mother; Heart attack in her maternal grandfather; Heart disease in her mother and paternal grandfather; Hypertension in her father; Miscarriages / [place] in her mother, sister, and sister; Stroke in her paternal grandmother.  Social History Patient  reports that she has never smoked. She has never used smokeless tobacco. She reports current alcohol use. She reports current drug use.    Objective: Vitals:   01/23/19 1349  BP: 100/84  Pulse: 82  Temp: 98.4 F (36.9 C)  TempSrc: Skin  SpO2: 98%  Weight: 222 lb 6.4 oz (100.9 kg)  Height: 5\' 5"  (1.651 m)    Body mass index is 37.01 kg/m.  Physical Exam Vitals signs reviewed.  Constitutional:      Appearance: Normal appearance. She is well-developed. She is obese.  HENT:     Head: Normocephalic and atraumatic.     Right Ear: External ear normal.     Left Ear: External ear normal.     Nose: Nose normal.     Mouth/Throat:     Mouth: Mucous membranes are moist.  Eyes:     Extraocular Movements: Extraocular movements intact.     Conjunctiva/sclera: Conjunctivae normal.     Pupils: Pupils are  equal, round, and reactive to light.  Neck:     Musculoskeletal: Normal range of motion and neck supple.     Thyroid: No thyromegaly.  Cardiovascular:     Rate and Rhythm: Normal rate and regular rhythm.     Pulses: Normal pulses.     Heart sounds: Normal heart sounds. No murmur.  Pulmonary:     Effort: Pulmonary effort is normal.     Breath sounds: Normal breath sounds.  Abdominal:     General: Bowel sounds  are normal. There is no distension.     Palpations: Abdomen is soft.     Tenderness: There is no abdominal tenderness.  Lymphadenopathy:     Cervical: No cervical adenopathy.  Skin:    General: Skin is warm and dry.     Capillary Refill: Capillary refill takes less than 2 seconds.     Findings: No rash.  Neurological:     General: No focal deficit present.     Mental Status: She is alert and oriented to person, place, and time.     Cranial Nerves: No cranial nerve deficit.     Coordination: Coordination normal.     Deep Tendon Reflexes: Reflexes normal.  Psychiatric:        Mood and Affect: Mood normal.        Behavior: Behavior normal.     Comments: No si/hi/ah/vh         Depression screen [facility] 2/9 01/23/2019 11/08/2018 11/30/2017 11/30/2017 06/17/2016  Decreased Interest 0 1 1 0 1  Down, Depressed, Hopeless 1 1 0 0 1  PHQ - 2 Score 1 2 1  0 2  Altered sleeping - 3 1 - 1  Tired, decreased energy - 2 1 - 1  Change in appetite - 2 1 - 0  Feeling bad or failure about yourself  - 2 1 - 1  Trouble concentrating - 2 0 - 0  Moving slowly or fidgety/restless - 2 1 - 0  Suicidal thoughts - 0 0 - 0  PHQ-9 Score - 15 6 - 5  Difficult doing work/chores - Not difficult at all Not difficult at all - -   GAD 7 : Generalized Anxiety Score 01/23/2019 11/08/2018 01/10/2018 11/30/2017  Nervous, Anxious, on Edge 1 3 2 3   Control/stop worrying 1 3 1 3   Worry too much - different things 1 3 1 3   Trouble relaxing 2 3 2 2   Restless 1 2 0 2  Easily annoyed or irritable 1 2 2 2   Afraid - awful might happen 2 3 2 3   Total GAD 7 Score 9 19 10 18   Anxiety Difficulty Not difficult at all Somewhat difficult Somewhat difficult -     Assessment/plan: 1. GAD (generalized anxiety disorder) GAD7 is significantly improved and she is happy with the medication. Will continue current dosage and refills given. Is interested in starting therapy as well and will give her information on this. Continue exercise/yoga.  Sleep hygiene and improvement likely helped as well.   2. Annual physical exam Routine labs today, she is not fasting. UTD on HM. Declines flu shot. Continue healthy diet and exercise. F/u in one year or as needed.  Patient counseling [x]    Nutrition: Stressed importance of moderation in sodium/caffeine intake, saturated fat and cholesterol, caloric balance, sufficient intake of fresh fruits, vegetables, fiber, calcium, iron, and 1 mg of folate supplement per day (for females capable of pregnancy).  [x]    Stressed the importance of regular exercise.   []   Substance Abuse: Discussed cessation/primary prevention of tobacco, alcohol, or other drug use; driving or other dangerous activities under the influence; availability of treatment for abuse.   [x]    Injury prevention: Discussed safety belts, safety helmets, smoke detector, smoking near bedding or upholstery.   [x]    Sexuality: Discussed sexually transmitted diseases, partner selection, use of condoms, avoidance of unintended pregnancy  and contraceptive alternatives.  [x]    Dental health: Discussed importance of regular tooth brushing, flossing, and dental visits.  [x]    Health maintenance and immunizations reviewed. Please refer to Health maintenance section.     3. Vitamin D deficiency -recheck today     Return in about 1 year (around 01/23/2020), or if symptoms worsen or fail to improve.    Orland MustardAllison Stoy Fenn, MD [facility]   01/23/2019

## 2019-01-24 ENCOUNTER — Other Ambulatory Visit: Payer: Self-pay | Admitting: Family Medicine

## 2019-01-24 LAB — HIV ANTIBODY (ROUTINE TESTING W REFLEX): HIV 1&2 Ab, 4th Generation: NONREACTIVE

## 2019-01-24 MED ORDER — VITAMIN D (ERGOCALCIFEROL) 1.25 MG (50000 UNIT) PO CAPS: ORAL_CAPSULE | ORAL | 0 refills | Status: DC

## 2019-03-22 ENCOUNTER — Ambulatory Visit: Payer: BC Managed Care – PPO | Attending: Internal Medicine

## 2019-03-22 DIAGNOSIS — Z20822 Contact with and (suspected) exposure to covid-19: Secondary | ICD-10-CM

## 2019-03-23 LAB — NOVEL CORONAVIRUS, NAA: SARS-CoV-2, NAA: NOT DETECTED

## 2019-03-25 ENCOUNTER — Other Ambulatory Visit: Payer: BC Managed Care – PPO

## 2019-04-16 ENCOUNTER — Ambulatory Visit: Payer: BC Managed Care – PPO | Attending: Internal Medicine

## 2019-04-16 DIAGNOSIS — Z20822 Contact with and (suspected) exposure to covid-19: Secondary | ICD-10-CM

## 2019-04-17 LAB — NOVEL CORONAVIRUS, NAA: SARS-CoV-2, NAA: NOT DETECTED

## 2019-04-24 ENCOUNTER — Ambulatory Visit: Payer: BC Managed Care – PPO | Attending: Internal Medicine

## 2019-04-24 DIAGNOSIS — Z20822 Contact with and (suspected) exposure to covid-19: Secondary | ICD-10-CM

## 2019-04-25 LAB — NOVEL CORONAVIRUS, NAA: SARS-CoV-2, NAA: NOT DETECTED

## 2019-04-29 ENCOUNTER — Ambulatory Visit: Payer: BC Managed Care – PPO | Attending: Internal Medicine

## 2019-04-29 DIAGNOSIS — Z20822 Contact with and (suspected) exposure to covid-19: Secondary | ICD-10-CM

## 2019-04-30 LAB — NOVEL CORONAVIRUS, NAA: SARS-CoV-2, NAA: NOT DETECTED

## 2019-05-22 ENCOUNTER — Ambulatory Visit (INDEPENDENT_AMBULATORY_CARE_PROVIDER_SITE_OTHER): Payer: BC Managed Care – PPO | Admitting: Family Medicine

## 2019-05-22 ENCOUNTER — Encounter: Payer: Self-pay | Admitting: Family Medicine

## 2019-05-22 ENCOUNTER — Ambulatory Visit (INDEPENDENT_AMBULATORY_CARE_PROVIDER_SITE_OTHER): Payer: BC Managed Care – PPO

## 2019-05-22 ENCOUNTER — Other Ambulatory Visit: Payer: Self-pay

## 2019-05-22 VITALS — BP 108/70 | HR 80 | Temp 98.0°F | Ht 65.0 in | Wt 221.0 lb

## 2019-05-22 DIAGNOSIS — G8929 Other chronic pain: Secondary | ICD-10-CM

## 2019-05-22 DIAGNOSIS — M79674 Pain in right toe(s): Secondary | ICD-10-CM

## 2019-05-22 DIAGNOSIS — M25572 Pain in left ankle and joints of left foot: Secondary | ICD-10-CM

## 2019-05-22 MED ORDER — IBUPROFEN 600 MG PO TABS: 600.0000 mg | ORAL_TABLET | Freq: Three times a day (TID) | ORAL | 0 refills | Status: DC | PRN

## 2019-05-22 NOTE — Patient Instructions (Addendum)
Get over the counter voltaren gel and can use up to four times a day on toe and ankle.   Can also take ibuprofen as needed up to three times a day with food.   Get really supportive shoes. I like hoka running shoes. Very supportive.   I think you have done something to your ligament or tendon in your ankle.  Your toe could be overuse from barre.

## 2019-05-22 NOTE — Progress Notes (Signed)
Patient: Samantha Shea MRN: [id number] DOB: Sep 10, 1990 PCP: Orland Mustard, MD     Subjective:  Chief Complaint  Patient presents with  . Toe Pain    Right; Starting 2 weeks ago. No drainage.     HPI: The patient is a 29 y.o. female who presents today for toe pain that started 2 weeks ago in her right toe. A week ago she started to get pain her right MCP joint of her toe. When she moves her toe she has pain in her who toe joint and in the pad of her foot. She has been doing a lot of barre classes which is up on her toes a lot. She has soaked in epsom salt and this helps some. No tylenol/ibuprofen. She has used a brace type thing for bunions and sports injuries and she thinks it helped some. Taking a day off from barre also really helped it.   She also has some left ankle pain that has come and gone since December. No injury or precipitating event. It does swell. 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 She has tried to wear a brace and it made it worse.   Review of Systems  Constitutional: Negative for chills, fatigue and fever.  HENT: Negative for sinus pressure, sinus pain and sore throat.   Respiratory: Negative for cough, shortness of breath and wheezing.   Cardiovascular: Negative for chest pain and palpitations.  Gastrointestinal: Negative for diarrhea, nausea and vomiting.  Musculoskeletal:       +toe pain   Neurological: Negative for dizziness and light-headedness.    Allergies Patient has No Known Allergies.  Past Medical History Patient  has a past medical history of Anxiety, Chicken pox, Frequent headaches, and Migraines.  Surgical History Patient  has a past surgical history that includes Knee surgery and gum graft (09/2018 01/2019).  Family History Pateint's family history includes Alcohol abuse in her father; Asthma in her mother; COPD in her mother; Cancer in her maternal grandmother; Drug abuse in her  father; Early death in her mother; Heart attack in her maternal grandfather; Heart disease in her mother and paternal grandfather; Hypertension in her father; Miscarriages / [place] in her mother, sister, and sister; Stroke in her paternal grandmother.  Social History Patient  reports that she has never smoked. She has never used smokeless tobacco. She reports current alcohol use. She reports current drug use.    Objective: Vitals:   05/22/19 1419  BP: 108/70  Pulse: 80  Temp: 98 F (36.7 C)  TempSrc: Temporal  SpO2: 96%  Weight: 221 lb (100.2 kg)  Height: 5\' 5"  (1.651 m)    Body mass index is 36.78 kg/m.  Physical Exam Vitals reviewed.  Constitutional:      Appearance: Normal appearance. She is obese.  HENT:     Head: Normocephalic and atraumatic.  Musculoskeletal:        General: Tenderness present. No deformity or signs of injury.     Right lower leg: No edema.     Left lower leg: No edema.     Comments: Right great hallux: no edema, erythema. TTP over MTP joint and distal metatarsal bone. Pain with varus strain of toe.  Left ankle: TTP over ankle just posterior to lateral malleolus. ROM intact, but painful. No erythema, mild edema. Gait normal    Neurological:     General: No focal deficit present.     Mental  Status: She is alert and oriented to person, place, and time.   toe xray: no acute findings. Reviewed by myself. Official ready pending Ankle xray: no acute findings. Reviewed by myself. Official read pending.      Assessment/plan: 1. Great toe pain, right Xray with no acute findings. likley has overused from barre class. Recommended supportive shoes, voltaren gel and nsaids prn. Recommend voltaren gel first since uses ssri. Referral to sports med also placed.  - DG Foot Complete Right; Future  2. Chronic pain of left ankle ? If ligament strain vs. Stress fracture. I dont' see anything on xray. Referral to sports medicine placed. voltaren gel daily. F/u  with specialist.  - DG Ankle Complete Left; Future    This visit occurred during the SARS-CoV-2 public health emergency.  Safety protocols were in place, including screening questions prior to the visit, additional usage of staff PPE, and extensive cleaning of exam room while observing appropriate contact time as indicated for disinfecting solutions.     Return if symptoms worsen or fail to improve.   Orma Flaming, MD [place]   05/22/2019

## 2019-05-30 ENCOUNTER — Encounter: Payer: Self-pay | Admitting: Family Medicine

## 2019-05-30 ENCOUNTER — Ambulatory Visit: Payer: Self-pay

## 2019-05-30 ENCOUNTER — Ambulatory Visit: Payer: BC Managed Care – PPO | Admitting: Family Medicine

## 2019-05-30 ENCOUNTER — Other Ambulatory Visit: Payer: Self-pay

## 2019-05-30 VITALS — BP 130/76 | HR 82 | Ht 65.0 in | Wt 222.0 lb

## 2019-05-30 DIAGNOSIS — M6788 Other specified disorders of synovium and tendon, other site: Secondary | ICD-10-CM | POA: Diagnosis not present

## 2019-05-30 NOTE — Patient Instructions (Addendum)
Thank you for coming in today. Try body helix full ankle sleeve.  I recommend you obtained a compression sleeve to help with your joint problems. There are many options on the market however I recommend obtaining a ankle Body Helix compression sleeve.  You can find information (including how to appropriate measure yourself for sizing) can be found at www.Body http://www.lambert.com/.  Many of these products are health savings account (HSA) eligible.   You can use the compression sleeve at any time throughout the day but is most important to use while being active as well as for 2 hours post-activity.   It is appropriate to ice following activity with the compression sleeve in place.  Do the exercises [facility] taught you.  Use voltaren gel on the ankle where it hurts up to 4x daily as needed.  If not improving in a few weeks let me know and I will order PT.   If not better in 4-6 weeks next step may be MRI.   Please perform the exercise program that we have prepared for you and gone over in detail on a daily basis.  In addition to the handout you were provided you can access your program through: www.my-exercise-code.com   Your unique program code is:  [email or web address]   Injection is also a possibility.    Peroneal Tendinopathy Rehab Ask your health care provider which exercises are safe for you. Do exercises exactly as told by your health care provider and adjust them as directed. It is normal to feel mild stretching, pulling, tightness, or discomfort as you do these exercises. Stop right away if you feel sudden pain or your pain gets worse. Do not begin these exercises until told by your health care provider. Stretching and range-of-motion exercises These exercises warm up your muscles and joints and improve the movement and flexibility of your ankle. These exercises also help to relieve pain and stiffness. Gastroc and soleus stretch, standing This is an exercise in which you stand on a step and use your body weight  to stretch your calf muscles. To do this exercise: 1. Stand on the edge of a step on the ball of your left / right foot. The ball of your foot is on the walking surface, right under your toes. 2. Keep your other foot firmly on the same step. 3. Hold on to the wall, a railing, or a chair for balance. 4. Slowly lift your other foot, allowing your body weight to press your left / right heel down over the edge of the step. You should feel a stretch in your left / right calf (gastrocnemius and soleus). 5. Hold this position for __________ seconds. 6. Return both feet to the step. 7. Repeat this exercise with a slight bend in your left / right knee. Repeat __________ times with your left / right knee straight and __________ times with your left / right knee bent. Complete this exercise __________ times a day. Strengthening exercises These exercises build strength and endurance in your foot and ankle. Endurance is the ability to use your muscles for a long time, even after they get tired. Ankle dorsiflexion with band  1. Secure a rubber exercise band or tube to an object, such as a table leg, that will not move when the band is pulled. 2. Secure the other end of the band around your left / right foot. 3. Sit on the floor, facing the object with your left / right leg extended. The band or tube should be  slightly tense when your foot is relaxed. 4. Slowly flex your left / right ankle and toes to bring your foot toward you (dorsiflexion). 5. Hold this position for __________ seconds. 6. Let the band or tube slowly pull your foot back to the starting position. Repeat __________ times. Complete this exercise __________ times a day. Ankle eversion 1. Sit on the floor with your legs straight out in front of you. 2. Loop a rubber exercise band or tube around the ball of your left / right foot. The ball of your foot is on the walking surface, right under your toes. 3. Hold the ends of the band in your hands,  or secure the band to a stable object. The band or tube should be slightly tense when your foot is relaxed. 4. Slowly push your foot outward, away from your other leg (eversion). 5. Hold this position for __________ seconds. 6. Slowly return your foot to the starting position. Repeat __________ times. Complete this exercise __________ times a day. Plantar flexion, standing This exercise is sometimes called standing heel raise. 1. Stand with your feet shoulder-width apart. 2. Place your hands on a wall or table to steady yourself as needed, but try not to use it for support. 3. Keep your weight spread evenly over the width of your feet while you slowly rise up on your toes (plantar flexion). If told by your health care provider: ? Shift your weight toward your left / right leg until you feel challenged. ? Stand on your left / right leg only. 4. Hold this position for __________ seconds. Repeat __________ times. Complete this exercise __________ times a day. Single leg stand 1. Without shoes, stand near a railing or in a doorway. You may hold on to the railing or door frame as needed. 2. Stand on your left / right foot. Keep your big toe down on the floor and try to keep your arch lifted. ? Do not roll to the outside of your foot. ? If this exercise is too easy, you can try it with your eyes closed or while standing on a pillow. 3. Hold this position for __________ seconds. Repeat __________ times. Complete this exercise __________ times a day. This information is not intended to replace advice given to you by your health care provider. Make sure you discuss any questions you have with your health care provider. Document Revised: 06/12/2018 Document Reviewed: 06/12/2018 Elsevier Patient Education  2020 [employer].

## 2019-05-30 NOTE — Progress Notes (Signed)
Subjective:    I'm seeing this patient as a consultation for:  Dr. Artis Flock. Note will be routed back to referring provider/PCP.  CC: R great toe and L ankle pain  I, Molly Weber, LAT, ATC, am serving as scribe for Dr. Clementeen Graham.  HPI: Pt is a 29 y/o female presenting w/ c/o R great toe and L ankle pain.  R great toe: Pt has been taking [employer] classes which she feels make contributing to her pain as it hurts to raise onto her toes / ball of foot.  Pt has been seen by her PCP for this and Voltaren gel recommended which is helping. -Swelling: No -Aggravating factors: heel raises -Treatments tried: Epsom salt soak; Voltaren gel which is helping  L ankle pain: Recalls no specific MOI other than wearing high heels one day for her engagement pictures a few weeks ago.  Since then, she reports pain at her L lateral Achille's w/ walking or any type of stretching of her L Achille's -Radiating pain: No -L ankle swelling: Yes at post ankle and Achille's -Aggravating factors: Walking, Achille's stretching, yoga poses such as down dog -Treatments tried:  Diagnostic imaging: R foot XR and L ankle XR- 05/22/19  Past medical history, Surgical history, Family history, Social history, Allergies, and medications have been entered into the medical record, reviewed.   Review of Systems: No new headache, visual changes, nausea, vomiting, diarrhea, constipation, dizziness, abdominal pain, skin rash, fevers, chills, night sweats, weight loss, swollen lymph nodes, body aches, joint swelling, muscle aches, chest pain, shortness of breath, mood changes, visual or auditory hallucinations.   Objective:    Vitals:   05/30/19 1546  BP: 130/76  Pulse: 82  SpO2: 98%   General: Well Developed, well nourished, and in no acute distress.  Neuro/Psych: Alert and oriented x3, extra-ocular muscles intact, able to move all 4 extremities, sensation grossly intact. Skin: Warm and dry, no rashes noted.  Respiratory: Not  using accessory muscles, speaking in full sentences, trachea midline.  Cardiovascular: Pulses palpable, no extremity edema. Abdomen: Does not appear distended. MSK:  Left ankle: Slightly swollen posterior calcaneus at insertion of Achilles tendon otherwise ankle is normal-appearing Normal ankle motion. Nontender Achilles tendon and posterior calcaneus. Mildly tender palpation posterior aspect of lateral malleolus along the course of the peroneal tendon. Normal foot and ankle motion. Some pain with resisted foot eversion. Stable ligamentous exam. Pulses cap refill and sensation are intact distally.  Lab and Radiology Results DG Ankle Complete Left  Result Date: 05/22/2019 CLINICAL DATA:  Chronic left ankle pain for 3 months. EXAM: LEFT ANKLE COMPLETE - 3+ VIEW COMPARISON:  None. FINDINGS: No fracture. Small bone island in the medial distal tibia. No other bone lesions. Ankle joint normally spaced and aligned.  No arthropathic changes. Soft tissues are unremarkable. IMPRESSION: Negative. Electronically Signed   By: Amie Portland M.D.   On: 05/22/2019 16:49   DG Foot Complete Right  Result Date: 05/22/2019 CLINICAL DATA:  Great toe pain. EXAM: RIGHT FOOT COMPLETE - 3+ VIEW COMPARISON:  None. FINDINGS: No fracture or bone lesion. Minor asymmetric joint space narrowing of the first metatarsal phalangeal joint with small marginal osteophytes. No periarticular erosions. Remaining joints normally spaced and aligned. Soft tissues are unremarkable. IMPRESSION: 1. No fracture, bone lesion or acute finding. 2. Mild first metatarsophalangeal joint osteoarthritis. Electronically Signed   By: Amie Portland M.D.   On: 05/22/2019 16:48   I, Clementeen Graham, personally (independently) visualized and performed the interpretation of  the images attached in this note.   Diagnostic Limited MSK Ultrasound of: Left ankle Achilles tendon normal-appearing at insertion.  No visible tear. Slight hypoechoic fluid tracking  superficial to Achilles tendon at posterior calcaneus near area of visible swelling. No retrocalcaneal bursa swelling. Posterior tibialis tendon normal-appearing along medial ankle.  Normal other tendon structures medial ankle. Peroneal tendons at lateral ankle visible without tear.  Slight hypoechoic fluid tracking along peroneal brevis tendon at lateral malleolus.  Otherwise lateral ankle normal-appearing Impression: Mild peroneal tendinitis.  Mild soft tissue swelling posterior calcaneus   Impression and Recommendations:    Assessment and Plan: 30 y.o. female with left ankle pain ongoing for several months.  Patient has had some initial conservative management but not a full course of conservative management.  Discussed options.  Plan for compressive ankle sleeve, Voltaren gel, and home exercises as taught in clinic today by ATC.  If not improving patient will notify me and we will proceed with trial of formal physical therapy.  If after completing course of conservative management still not better would proceed with MRI to further characterize cause of pain and for potential injection planning.  Patient expresses understanding and agreement we will recheck back as needed.  PDMP not reviewed this encounter. Orders Placed This Encounter  Procedures  . [place] LIMITED JOINT SPACE STRUCTURES LOW LEFT(NO LINKED CHARGES)    Order Specific Question:   Reason for Exam (SYMPTOM  OR DIAGNOSIS REQUIRED)    Answer:   L Achille's pain    Order Specific Question:   Preferred imaging location?    Answer:   [facility]   No orders of the defined types were placed in this encounter.   Discussed warning signs or symptoms. Please see discharge instructions. Patient expresses understanding.   The above documentation has been reviewed and is accurate and complete Lynne Leader

## 2019-08-26 ENCOUNTER — Encounter: Payer: Self-pay | Admitting: Physician Assistant

## 2019-08-26 ENCOUNTER — Other Ambulatory Visit: Payer: Self-pay

## 2019-08-26 ENCOUNTER — Telehealth (INDEPENDENT_AMBULATORY_CARE_PROVIDER_SITE_OTHER): Payer: BC Managed Care – PPO | Admitting: Physician Assistant

## 2019-08-26 VITALS — Temp 98.2°F | Ht 65.0 in | Wt 220.0 lb

## 2019-08-26 DIAGNOSIS — J029 Acute pharyngitis, unspecified: Secondary | ICD-10-CM | POA: Diagnosis not present

## 2019-08-26 LAB — POCT RAPID STREP A (OFFICE): Rapid Strep A Screen: NEGATIVE

## 2019-08-26 NOTE — Progress Notes (Signed)
Virtual Visit via Video   I connected with Samantha Shea on 08/26/19 at 12:00 PM EDT by a video enabled telemedicine application and verified that I am speaking with the correct person using two identifiers. Location patient: Home Location provider: [facility], Office Persons participating in the virtual visit: Amiyrah, Lamere PA-C, Corky Mull, LPN   I discussed the limitations of evaluation and management by telemedicine and the availability of in person appointments. The patient expressed understanding and agreed to proceed.  I acted as a Neurosurgeon for [employer], PA-C [employer], LPN   Subjective:   HPI:    Sore throat Pt c/o sore throat since last evening, slight headache, runny nose with clear drainage. Denies fever, chills, neck stiffness, inability to swallow, malaise.  Pt has not tried flonase, uses this regularly.  Has not had her COVID-19 vaccine.  Has been exposed to strep recently, she works in a daycare.    ROS: See pertinent positives and negatives per HPI.  Patient Active Problem List   Diagnosis Date Noted  . Vitamin D deficiency 12/01/2017  . GAD (generalized anxiety disorder) 11/30/2017  . Concussion 11/28/2017    Social History   Tobacco Use  . Smoking status: Never Smoker  . Smokeless tobacco: Never Used  Substance Use Topics  . Alcohol use: Yes    Comment: occasional    Current Outpatient Medications:  .  cholecalciferol (VITAMIN D) 1000 units tablet, Take 2,000 Units by mouth daily., Disp: , Rfl:  .  FLUoxetine (PROZAC) 20 MG capsule, Take 1 capsule (20 mg total) by mouth daily. Must come in for appt. For future refills., Disp: 90 capsule, Rfl: 3 .  fluticasone (FLONASE) 50 MCG/ACT nasal spray, Place 2 sprays into both nostrils daily., Disp: 16 g, Rfl: 6 .  hydrOXYzine (ATARAX/VISTARIL) 25 MG tablet, Take 1 tablet (25 mg total) by mouth 3 (three) times daily as needed for anxiety., Disp: 60 tablet, Rfl: 1 .   ibuprofen (ADVIL) 600 MG tablet, Take 1 tablet (600 mg total) by mouth every 8 (eight) hours as needed., Disp: 30 tablet, Rfl: 0 .  chlorhexidine (PERIDEX) 0.12 % solution, RINSE PO UTD BID IN THE MORNING AND BEFORE BEDTIME FOR 30 SECONDS (Patient not taking: Reported on 08/26/2019), Disp: , Rfl:   No Known Allergies  Objective:   VITALS: Per patient if applicable, see vitals. GENERAL: Alert, appears well and in no acute distress. HEENT: Atraumatic, conjunctiva clear, no obvious abnormalities on inspection of external nose and ears. NECK: Normal movements of the head and neck. CARDIOPULMONARY: No increased WOB. Speaking in clear sentences. I:E ratio WNL.  MS: Moves all visible extremities without noticeable abnormality. PSYCH: Pleasant and cooperative, well-groomed. Speech normal rate and rhythm. Affect is appropriate. Insight and judgement are appropriate. Attention is focused, linear, and appropriate.  NEURO: CN grossly intact. Oriented as arrived to appointment on time with no prompting. Moves both UE equally.  SKIN: No obvious lesions, wounds, erythema, or cyanosis noted on face or hands.  Assessment and Plan:   Samantha Shea was seen today for sore throat.  Diagnoses and all orders for this visit:  Pharyngitis, unspecified etiology -     POCT rapid strep A   Strep test negative. Suspect viral illness vs allergies. Symptomatic care discussed. Worsening precautions advised. No indication for antibiotics at this time.  . Reviewed expectations re: course of current medical issues. . Discussed self-management of symptoms. . Outlined signs and symptoms indicating need for more acute  intervention. . Patient verbalized understanding and all questions were answered. Marland Kitchen Health Maintenance issues including appropriate healthy diet, exercise, and smoking avoidance were discussed with patient. . See orders for this visit as documented in the electronic medical record.  I discussed the assessment  and treatment plan with the patient. The patient was provided an opportunity to ask questions and all were answered. The patient agreed with the plan and demonstrated an understanding of the instructions.   The patient was advised to call back or seek an in-person evaluation if the symptoms worsen or if the condition fails to improve as anticipated.   CMA or LPN served as scribe during this visit. History, Physical, and Plan performed by medical provider. The above documentation has been reviewed and is accurate and complete.   [place], [place] 08/26/2019

## 2020-01-27 ENCOUNTER — Encounter: Payer: BC Managed Care – PPO | Admitting: Family Medicine

## 2020-02-03 ENCOUNTER — Ambulatory Visit (INDEPENDENT_AMBULATORY_CARE_PROVIDER_SITE_OTHER): Payer: BC Managed Care – PPO | Admitting: Family Medicine

## 2020-02-03 ENCOUNTER — Encounter: Payer: Self-pay | Admitting: Family Medicine

## 2020-02-03 ENCOUNTER — Other Ambulatory Visit: Payer: Self-pay

## 2020-02-03 VITALS — BP 138/90 | HR 86 | Temp 97.9°F | Ht 65.0 in | Wt 227.4 lb

## 2020-02-03 DIAGNOSIS — E559 Vitamin D deficiency, unspecified: Secondary | ICD-10-CM

## 2020-02-03 DIAGNOSIS — Z1159 Encounter for screening for other viral diseases: Secondary | ICD-10-CM | POA: Diagnosis not present

## 2020-02-03 DIAGNOSIS — N926 Irregular menstruation, unspecified: Secondary | ICD-10-CM

## 2020-02-03 DIAGNOSIS — Z0000 Encounter for general adult medical examination without abnormal findings: Secondary | ICD-10-CM | POA: Diagnosis not present

## 2020-02-03 DIAGNOSIS — F411 Generalized anxiety disorder: Secondary | ICD-10-CM

## 2020-02-03 DIAGNOSIS — N938 Other specified abnormal uterine and vaginal bleeding: Secondary | ICD-10-CM

## 2020-02-03 MED ORDER — HYDROXYZINE HCL 25 MG PO TABS
25.0000 mg | ORAL_TABLET | Freq: Three times a day (TID) | ORAL | 1 refills | Status: DC | PRN
Start: 2020-02-03 — End: 2021-10-06

## 2020-02-03 MED ORDER — FLUTICASONE PROPIONATE 50 MCG/ACT NA SUSP: 2.0000 | Freq: Every day | NASAL | 1 refills | Status: DC

## 2020-02-03 NOTE — Progress Notes (Signed)
Patient: Samantha Shea MRN: [id number] DOB: 1990/10/26 PCP: Orland Mustard, MD     Subjective:  Chief Complaint  Patient presents with  . Annual Exam  . Anxiety    HPI: The patient is a 29 y.o. female who presents today for annual exam. She denies any changes to past medical history. There have been no recent hospitalizations. They are following a well balanced diet and exercise plan. She has Weight has been stable. No complaints today. Stopped taking BC last November, hasn't had a period since. Spotting.Marland Kitchengot worse.. 28 days. Last week spotting. She is trying to get pregnancy.   No family history of breast or colon cancer.   Anxiety Was on prozac 20mg . She has stopped taking this and is doing well. She does take the hydroxyzine prn and likes this a lot since doesn't need daily. She feels like she controls well off the prozac.   She is trying to get pregnant, but since she stopped her ocp.   There is no immunization history on file for this patient. Colonoscopy: routine screening  Mammogram: routine screening  Pap smear: 08/15/2017 Hep C: today    Review of Systems  Constitutional: Negative for chills, fatigue and fever.  HENT: Positive for ear pain. Negative for dental problem, hearing loss and trouble swallowing.   Eyes: Negative for visual disturbance.  Respiratory: Negative for cough, chest tightness and shortness of breath.   Cardiovascular: Negative for chest pain, palpitations and leg swelling.  Gastrointestinal: Negative for abdominal pain, blood in stool, diarrhea and nausea.  Endocrine: Negative for cold intolerance, polydipsia, polyphagia and polyuria.  Genitourinary: Positive for menstrual problem. Negative for dysuria and hematuria.  Musculoskeletal: Negative for arthralgias.  Skin: Negative for rash.  Neurological: Negative for dizziness and headaches.  Psychiatric/Behavioral: Negative for dysphoric mood, sleep disturbance and suicidal ideas. The patient is not  nervous/anxious.     Allergies Patient has No Known Allergies.  Past Medical History Patient  has a past medical history of Anxiety, Chicken pox, Frequent headaches, and Migraines.  Surgical History Patient  has a past surgical history that includes Knee surgery and gum graft (09/2018 01/2019).  Family History Pateint's family history includes Alcohol abuse in her father; Asthma in her mother; COPD in her mother; Cancer in her maternal grandmother; Drug abuse in her father; Early death in her mother; Heart attack in her maternal grandfather; Heart disease in her mother and paternal grandfather; Hypertension in her father; Miscarriages / 02/2019 in her mother, sister, and sister; Stroke in her paternal grandmother.  Social History Patient  reports that she has never smoked. She has never used smokeless tobacco. She reports current alcohol use. She reports current drug use.    Objective: Vitals:   02/03/20 0902 02/03/20 0937  BP: (!) 145/96 138/90  Pulse: 86   Temp: 97.9 F (36.6 C)   TempSrc: Temporal   SpO2: 98%   Weight: 227 lb 6.4 oz (103.1 kg)   Height: 5\' 5"  (1.651 m)     Body mass index is 37.84 kg/m.  Physical Exam Vitals reviewed.  Constitutional:      Appearance: Normal appearance. She is well-developed. She is obese.  HENT:     Head: Normocephalic and atraumatic.     Right Ear: Tympanic membrane, ear canal and external ear normal.     Left Ear: Tympanic membrane, ear canal and external ear normal.     Nose: Nose normal.     Mouth/Throat:     Mouth: Mucous membranes are  moist.  Eyes:     Extraocular Movements: Extraocular movements intact.     Conjunctiva/sclera: Conjunctivae normal.     Pupils: Pupils are equal, round, and reactive to light.  Neck:     Thyroid: No thyromegaly.  Cardiovascular:     Rate and Rhythm: Normal rate and regular rhythm.     Pulses: Normal pulses.     Heart sounds: Normal heart sounds. No murmur heard.   Pulmonary:      Effort: Pulmonary effort is normal.     Breath sounds: Normal breath sounds.  Abdominal:     General: Bowel sounds are normal. There is no distension.     Palpations: Abdomen is soft.     Tenderness: There is no abdominal tenderness.  Musculoskeletal:     Cervical back: Normal range of motion and neck supple.  Lymphadenopathy:     Cervical: No cervical adenopathy.  Skin:    General: Skin is warm and dry.     Capillary Refill: Capillary refill takes less than 2 seconds.     Findings: No rash.  Neurological:     General: No focal deficit present.     Mental Status: She is alert and oriented to person, place, and time.     Cranial Nerves: No cranial nerve deficit.     Coordination: Coordination normal.     Deep Tendon Reflexes: Reflexes normal.  Psychiatric:        Mood and Affect: Mood normal.        Behavior: Behavior normal.        GAD 7 : Generalized Anxiety Score 02/03/2020 01/23/2019 11/08/2018 01/10/2018  Nervous, Anxious, on Edge 1 1 3 2   Control/stop worrying 1 1 3 1   Worry too much - different things 1 1 3 1   Trouble relaxing 0 2 3 2   Restless 2 1 2  0  Easily annoyed or irritable 1 1 2 2   Afraid - awful might happen 2 2 3 2   Total GAD 7 Score 8 9 19 10   Anxiety Difficulty Not difficult at all Not difficult at all Somewhat difficult Somewhat difficult      Assessment/plan: 1. Annual physical exam Routine fasting labs today. HM reviewed. Encouraged exercise/diet/weight loss. Trying to get pregnant and recommended daily PNV. OB/GYN docs given as well. Overall doing well.  Patient counseling [x]    Nutrition: Stressed importance of moderation in sodium/caffeine intake, saturated fat and cholesterol, caloric balance, sufficient intake of fresh fruits, vegetables, fiber, calcium, iron, and 1 mg of folate supplement per day (for females capable of pregnancy).  [x]    Stressed the importance of regular exercise.   []    Substance Abuse: Discussed cessation/primary prevention  of tobacco, alcohol, or other drug use; driving or other dangerous activities under the influence; availability of treatment for abuse.   [x]    Injury prevention: Discussed safety belts, safety helmets, smoke detector, smoking near bedding or upholstery.   [x]    Sexuality: Discussed sexually transmitted diseases, partner selection, use of condoms, avoidance of unintended pregnancy  and contraceptive alternatives.  [x]    Dental health: Discussed importance of regular tooth brushing, flossing, and dental visits.  [x]    Health maintenance and immunizations reviewed. Please refer to Health maintenance section.    - CBC with Differential/Platelet; Future - Lipid panel; Future - TSH; Future - COMPLETE METABOLIC PANEL WITH GFR; Future - COMPLETE METABOLIC PANEL WITH GFR - TSH - Lipid panel - CBC with Differential/Platelet  2. GAD (generalized anxiety disorder) GAD7 score not significantly  changed off medication. Score is mild. Continue with hydroxyzine prn. Discussed safety of this in pregnancy, but only takes on a very as needed basis. Encouraged exercise. Doing well, but she is to let me know if feels like not coping as well.   3. Vitamin D deficiency  - VITAMIN D 25 Hydroxy (Vit-D Deficiency, Fractures); Future - VITAMIN D 25 Hydroxy (Vit-D Deficiency, Fractures)  4. Encounter for hepatitis C screening test for low risk patient  - Hepatitis C antibody; Future - Hepatitis C antibody  5. Missed period  - hCG, quantitative, pregnancy; Future - hCG, quantitative, pregnancy  6. DUB (dysfunctional uterine bleeding) ? If hx of PCOS. Checking routine labs/pregnancy today. Needs to see gyn to see about progesterone challenge or other work up especially since trying to get pregnancy.  - Prolactin; Future - Prolactin    This visit occurred during the SARS-CoV-2 public health emergency.  Safety protocols were in place, including screening questions prior to the visit, additional usage of  staff PPE, and extensive cleaning of exam room while observing appropriate contact time as indicated for disinfecting solutions.     Return in about 1 year (around 02/02/2021) for annual .     Orland Mustard, MD [facility]  02/03/2020 an

## 2020-02-03 NOTE — Patient Instructions (Addendum)
-physician for [facility] (rotate docs so don't know who will deliver you unless induced)  -[facility] -dr. Jeral Fruit bovard   If you need a referral (which you shouldn't) just email me!   -make sure on prenatal daily.  -hydroxyzine prn okay   WATCH YOUR BLOOD PRESSURE!!!   Common Medications Safe in Pregnancy  Acne:      Constipation:  Benzoyl Peroxide     Colace  Clindamycin      Dulcolax Suppository  Topica Erythromycin     Fibercon  Salicylic Acid      Metamucil         Miralax AVOID:        Senakot   Accutane    Cough:  Retin-A       Cough Drops  Tetracycline      Phenergan w/ Codeine if Rx  Minocycline      Robitussin (Plain & DM)  Antibiotics:     Crabs/Lice:  Ceclor       RID  Cephalosporins    AVOID:  E-Mycins      Kwell  Keflex  Macrobid/Macrodantin   Diarrhea:  Penicillin      Kao-Pectate  Zithromax      Imodium AD         PUSH FLUIDS AVOID:       Cipro     Fever:  Tetracycline      Tylenol (Regular or Extra  Minocycline       Strength)  Levaquin      Extra Strength-Do not          Exceed 8 tabs/24 hrs Caffeine:        <23m/day (equiv. To 1 cup of coffee or  approx. 3 12 oz sodas)         Gas: Cold/Hayfever:       Gas-X  Benadryl      Mylicon  Claritin       Phazyme  **Claritin-D        Chlor-Trimeton    Headaches:  Dimetapp      ASA-Free Excedrin  Drixoral-Non-Drowsy     Cold Compress  Mucinex (Guaifenasin)     Tylenol (Regular or Extra  Sudafed/Sudafed-12 Hour     Strength)  **Sudafed PE Pseudoephedrine   Tylenol Cold & Sinus     Vicks Vapor Rub  Zyrtec  **AVOID if Problems With Blood Pressure         Heartburn: Avoid lying down for at least 1 hour after meals  Aciphex      Maalox     Rash:  Milk of Magnesia     Benadryl    Mylanta       1% Hydrocortisone Cream  Pepcid  Pepcid Complete   Sleep Aids:  Prevacid      Ambien   Prilosec       Benadryl  Rolaids       Chamomile Tea  Tums (Limit 4/day)     Unisom         Tylenol  PM         Warm milk-add vanilla or  Hemorrhoids:       Sugar for taste  Anusol/Anusol H.C.  (RX: Analapram 2.5%)  Sugar Substitutes:  Hydrocortisone OTC     Ok in moderation  Preparation H      Tucks        Vaseline lotion applied to tissue with wiping    Herpes:     Throat:  Acyclovir  Oragel  Famvir  Valtrex     Vaccines:         Flu Shot Leg Cramps:       *Gardasil  Benadryl      Hepatitis A         Hepatitis B Nasal Spray:       Pneumovax  Saline Nasal Spray     Polio Booster         Tetanus Nausea:       Tuberculosis test or PPD  Vitamin B6 25 mg TID   AVOID:    Dramamine      *Gardasil  Emetrol       Live Poliovirus  Ginger Root 250 mg QID    MMR (measles, mumps &  High Complex Carbs @ Bedtime    rebella)  Sea Bands-Accupressure    Varicella (Chickenpox)  Unisom 1/2 tab TID     *No known complications           If received before Pain:         Known pregnancy;   Darvocet       Resume series after  Lortab        Delivery  Percocet    Yeast:   Tramadol      Femstat  Tylenol 3      Gyne-lotrimin  Ultram       Monistat  Vicodin           MISC:         All Sunscreens           Hair Coloring/highlights          Insect Repellant's          (Including DEET)         Mystic Tans

## 2020-02-04 LAB — CBC WITH DIFFERENTIAL/PLATELET
Absolute Monocytes: 455 cells/uL (ref 200–950)
Basophils Absolute: 28 cells/uL (ref 0–200)
Basophils Relative: 0.4 %
Eosinophils Absolute: 518 cells/uL — ABNORMAL HIGH (ref 15–500)
Eosinophils Relative: 7.5 %
HCT: 44.2 % (ref 35.0–45.0)
Hemoglobin: 14.9 g/dL (ref 11.7–15.5)
Lymphs Abs: 1497 cells/uL (ref 850–3900)
MCH: 31.8 pg (ref 27.0–33.0)
MCHC: 33.7 g/dL (ref 32.0–36.0)
MCV: 94.2 fL (ref 80.0–100.0)
MPV: 11.6 fL (ref 7.5–12.5)
Monocytes Relative: 6.6 %
Neutro Abs: 4402 cells/uL (ref 1500–7800)
Neutrophils Relative %: 63.8 %
Platelets: 241 10*3/uL (ref 140–400)
RBC: 4.69 10*6/uL (ref 3.80–5.10)
RDW: 12.3 % (ref 11.0–15.0)
Total Lymphocyte: 21.7 %
WBC: 6.9 10*3/uL (ref 3.8–10.8)

## 2020-02-04 LAB — COMPLETE METABOLIC PANEL WITH GFR
AG Ratio: 1.6 (calc) (ref 1.0–2.5)
ALT: 19 U/L (ref 6–29)
AST: 13 U/L (ref 10–30)
Albumin: 4.5 g/dL (ref 3.6–5.1)
Alkaline phosphatase (APISO): 61 U/L (ref 31–125)
BUN: 15 mg/dL (ref 7–25)
CO2: 27 mmol/L (ref 20–32)
Calcium: 9.8 mg/dL (ref 8.6–10.2)
Chloride: 104 mmol/L (ref 98–110)
Creat: 0.83 mg/dL (ref 0.50–1.10)
GFR, Est African American: 111 mL/min/{1.73_m2} (ref >=60)
GFR, Est Non African American: 96 mL/min/{1.73_m2} (ref >=60)
Globulin: 2.9 g/dL (calc) (ref 1.9–3.7)
Glucose, Bld: 103 mg/dL — ABNORMAL HIGH (ref 65–99)
Potassium: 4.7 mmol/L (ref 3.5–5.3)
Sodium: 140 mmol/L (ref 135–146)
Total Bilirubin: 0.6 mg/dL (ref 0.2–1.2)
Total Protein: 7.4 g/dL (ref 6.1–8.1)

## 2020-02-04 LAB — LIPID PANEL
Cholesterol: 181 mg/dL (ref <200)
HDL: 43 mg/dL — ABNORMAL LOW (ref >=50)
LDL Cholesterol (Calc): 113 mg/dL (calc) — ABNORMAL HIGH
Non-HDL Cholesterol (Calc): 138 mg/dL (calc) — ABNORMAL HIGH (ref <130)
Total CHOL/HDL Ratio: 4.2 (calc) (ref <5.0)
Triglycerides: 130 mg/dL (ref <150)

## 2020-02-04 LAB — PROLACTIN: Prolactin: 9.1 ng/mL

## 2020-02-04 LAB — VITAMIN D 25 HYDROXY (VIT D DEFICIENCY, FRACTURES): Vit D, 25-Hydroxy: 21 ng/mL — ABNORMAL LOW (ref 30–100)

## 2020-02-04 LAB — HEPATITIS C ANTIBODY
Hepatitis C Ab: NONREACTIVE
SIGNAL TO CUT-OFF: 0.03 (ref <1.00)

## 2020-02-04 LAB — TSH: TSH: 3.85 mIU/L

## 2020-02-04 LAB — HCG, QUANTITATIVE, PREGNANCY: HCG, Total, QN: <3 m[IU]/mL

## 2020-02-05 ENCOUNTER — Other Ambulatory Visit: Payer: Self-pay | Admitting: Family Medicine

## 2020-02-05 DIAGNOSIS — N938 Other specified abnormal uterine and vaginal bleeding: Secondary | ICD-10-CM

## 2020-02-05 MED ORDER — VITAMIN D (ERGOCALCIFEROL) 1.25 MG (50000 UNIT) PO CAPS: ORAL_CAPSULE | ORAL | 0 refills | Status: DC

## 2020-02-13 ENCOUNTER — Ambulatory Visit
Admission: RE | Admit: 2020-02-13 | Discharge: 2020-02-13 | Disposition: A | Discharge status: Home / Self Care | Payer: BC Managed Care – PPO | Source: Ambulatory Visit | Attending: Family Medicine | Admitting: Family Medicine

## 2020-02-13 ENCOUNTER — Other Ambulatory Visit: Payer: Self-pay

## 2020-02-13 DIAGNOSIS — N938 Other specified abnormal uterine and vaginal bleeding: Secondary | ICD-10-CM
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
## 2020-12-21 ENCOUNTER — Other Ambulatory Visit: Payer: Self-pay

## 2020-12-21 ENCOUNTER — Encounter: Payer: Self-pay | Admitting: Family

## 2020-12-21 ENCOUNTER — Ambulatory Visit: Payer: BC Managed Care – PPO | Admitting: Family

## 2020-12-21 VITALS — BP 132/84 | HR 89 | Temp 97.9°F | Ht 65.0 in | Wt 234.6 lb

## 2020-12-21 DIAGNOSIS — Z7689 Persons encountering health services in other specified circumstances: Secondary | ICD-10-CM | POA: Diagnosis not present

## 2020-12-21 DIAGNOSIS — N926 Irregular menstruation, unspecified: Secondary | ICD-10-CM

## 2020-12-21 NOTE — Assessment & Plan Note (Addendum)
Refuses flu shot today. Advised pt on prevalence in community.Reports she and husband are actively trying to get pregnant. Menstrual cycles have been a little irregular making it more difficult.

## 2020-12-21 NOTE — Progress Notes (Addendum)
Subjective:     Patient ID: Samantha Shea, female    DOB: [birth year], 30 y.o.   MRN: [id number]  Chief Complaint  Patient presents with   Transitions Of Care    HPI Patient is in today to establish care, and to discuss one acute problem. Irregular Menstruation: Patient complains of irregular menses. No LMP recorded. Periods are lasting  varies month to month.  Missed periods: yes. Dysmenorrhea: no. Menstrual symptoms include heavy, moodiness, constipation. Current contraception: none.  History of abnormal Pap smear: no   Health Maintenance Due  Topic Date Due   PAP-Cervical Cytology Screening  08/15/2020   PAP SMEAR-Modifier  08/15/2020    Past Medical History:  Diagnosis Date   Anxiety    Chicken pox    Frequent headaches    Migraines     Past Surgical History:  Procedure Laterality Date   gum graft  09/2018 01/2019   KNEE SURGERY      Outpatient Medications Prior to Visit  Medication Sig Dispense Refill   cholecalciferol (VITAMIN D) 1000 units tablet Take 2,000 Units by mouth daily.     fluticasone (FLONASE) 50 MCG/ACT nasal spray Place 2 sprays into both nostrils daily. 48 g 1   hydrOXYzine (ATARAX/VISTARIL) 25 MG tablet Take 1 tablet (25 mg total) by mouth 3 (three) times daily as needed for anxiety. 90 tablet 1   Prenatal Vit-Fe Fumarate-FA (PRENATAL MULTIVITAMIN) TABS tablet Take 1 tablet by mouth daily at 12 noon.     chlorhexidine (PERIDEX) 0.12 % solution RINSE PO UTD BID IN THE MORNING AND BEFORE BEDTIME FOR 30 SECONDS (Patient not taking: No sig reported)     Vitamin D, Ergocalciferol, (DRISDOL) 1.25 MG (50000 UNIT) CAPS capsule One capsule by mouth once a week for 8 weeks. Then take 2000IU/day (Patient not taking: Reported on 12/21/2020) 8 capsule 0   No facility-administered medications prior to visit.    No Known Allergies      Objective:    Physical Exam Vitals and nursing note reviewed.  Constitutional:      Appearance: Normal  appearance.  Cardiovascular:     Rate and Rhythm: Normal rate and regular rhythm.  Pulmonary:     Effort: Pulmonary effort is normal.     Breath sounds: Normal breath sounds.  Musculoskeletal:        General: Normal range of motion.  Skin:    General: Skin is warm and dry.  Neurological:     Mental Status: She is alert.  Psychiatric:        Mood and Affect: Mood normal.        Behavior: Behavior normal.   BP 132/84   Pulse 89   Temp 97.9 F (36.6 C) (Temporal)   Ht 5\' 5"  (1.651 m)   Wt 234 lb 9.6 oz (106.4 kg)   SpO2 98%   BMI 39.04 kg/m  Wt Readings from Last 3 Encounters:  12/21/20 234 lb 9.6 oz (106.4 kg)  02/03/20 227 lb 6.4 oz (103.1 kg)  08/26/19 220 lb (99.8 kg)       Assessment & Plan:   Problem List Items Addressed This Visit       Other   Encounter to establish care with new doctor - Primary    Refuses flu shot today. Advised pt on prevalence in community.Reports she and husband are actively trying to get pregnant. Menstrual cycles have been a little irregular making it more difficult.      Irregular menses  Reports skipping cycles or cycles are very long or short. Pt and partner have been trying to get pregnant for the last 6 mos or so. Advised on OTC ovulation kits, pt has tried this. Denies dysmenorrhea. Advised pt on scheduling an annual visit and we can test for PCOS.         No orders of the defined types were placed in this encounter.

## 2020-12-21 NOTE — Patient Instructions (Signed)
It was a pleasure meeting you! Please use MyChart to communicate with me or any office staff. You can order medication refills, check on labs and any referrals we may have ordered for you, as well as your vaccine status.   Please try these tips to maintain a healthy lifestyle:  Eat most of your calories during the day when you are active. Eliminate processed foods including packaged sweets (pies, cakes, cookies), reduce intake of potatoes, white bread, white pasta, and white rice. Look for whole grain options, oat flour or almond flour.  Each meal should contain half fruits/vegetables, one quarter protein, and one quarter carbs (no bigger than a computer mouse).  Cut down on sweet beverages. This includes juice, soda, and sweet tea. Also watch fruit intake, though this is a healthier sweet option, it still contains natural sugar! Limit to 3 servings daily.  Drink at least 1 glass of water with each meal and aim for at least 8 glasses per day  Exercise at least 150 minutes every week.

## 2021-01-03 DIAGNOSIS — N926 Irregular menstruation, unspecified: Secondary | ICD-10-CM | POA: Insufficient documentation

## 2021-01-03 NOTE — Assessment & Plan Note (Signed)
Reports skipping cycles or cycles are very long or short. Pt and partner have been trying to get pregnant for the last 6 mos or so. Advised on OTC ovulation kits, pt has tried this. Denies dysmenorrhea. Advised pt on scheduling an annual visit and we can test for PCOS.

## 2021-01-03 NOTE — Addendum Note (Signed)
Addended byDulce Sellar on: 01/03/2021 06:17 PM   Modules accepted: Orders

## 2021-02-04 ENCOUNTER — Encounter: Payer: BC Managed Care – PPO | Admitting: Family Medicine

## 2021-08-05 ENCOUNTER — Encounter: Payer: Self-pay | Admitting: Family

## 2021-10-06 ENCOUNTER — Ambulatory Visit (INDEPENDENT_AMBULATORY_CARE_PROVIDER_SITE_OTHER): Payer: BC Managed Care – PPO | Admitting: Family

## 2021-10-06 ENCOUNTER — Encounter: Payer: Self-pay | Admitting: Family

## 2021-10-06 VITALS — BP 122/78 | HR 77 | Temp 98.3°F | Ht 65.0 in | Wt 243.0 lb

## 2021-10-06 DIAGNOSIS — Z0000 Encounter for general adult medical examination without abnormal findings: Secondary | ICD-10-CM

## 2021-10-06 LAB — COMPREHENSIVE METABOLIC PANEL
ALT: 26 U/L (ref 0–35)
AST: 17 U/L (ref 0–37)
Albumin: 4.5 g/dL (ref 3.5–5.2)
Alkaline Phosphatase: 53 U/L (ref 39–117)
BUN: 16 mg/dL (ref 6–23)
CO2: 26 mEq/L (ref 19–32)
Calcium: 9.4 mg/dL (ref 8.4–10.5)
Chloride: 103 mEq/L (ref 96–112)
Creatinine, Ser: 0.83 mg/dL (ref 0.40–1.20)
GFR: 95.05 mL/min (ref >60.00)
Glucose, Bld: 95 mg/dL (ref 70–99)
Potassium: 4.6 mEq/L (ref 3.5–5.1)
Sodium: 138 mEq/L (ref 135–145)
Total Bilirubin: 0.3 mg/dL (ref 0.2–1.2)
Total Protein: 7.5 g/dL (ref 6.0–8.3)

## 2021-10-06 LAB — CBC WITH DIFFERENTIAL/PLATELET
Basophils Absolute: 0 10*3/uL (ref 0.0–0.1)
Basophils Relative: 0.8 % (ref 0.0–3.0)
Eosinophils Absolute: 0.3 10*3/uL (ref 0.0–0.7)
Eosinophils Relative: 5.8 % — ABNORMAL HIGH (ref 0.0–5.0)
HCT: 42.2 % (ref 36.0–46.0)
Hemoglobin: 14.1 g/dL (ref 12.0–15.0)
Lymphocytes Relative: 30.6 % (ref 12.0–46.0)
Lymphs Abs: 1.7 10*3/uL (ref 0.7–4.0)
MCHC: 33.5 g/dL (ref 30.0–36.0)
MCV: 92.5 fl (ref 78.0–100.0)
Monocytes Absolute: 0.5 10*3/uL (ref 0.1–1.0)
Monocytes Relative: 9.3 % (ref 3.0–12.0)
Neutro Abs: 3 10*3/uL (ref 1.4–7.7)
Neutrophils Relative %: 53.5 % (ref 43.0–77.0)
Platelets: 232 10*3/uL (ref 150.0–400.0)
RBC: 4.56 Mil/uL (ref 3.87–5.11)
RDW: 12.9 % (ref 11.5–15.5)
WBC: 5.6 10*3/uL (ref 4.0–10.5)

## 2021-10-06 LAB — LIPID PANEL
Cholesterol: 152 mg/dL (ref 0–200)
HDL: 39.7 mg/dL (ref >39.00)
LDL Cholesterol: 97 mg/dL (ref 0–99)
NonHDL: 112.64
Total CHOL/HDL Ratio: 4
Triglycerides: 79 mg/dL (ref 0.0–149.0)
VLDL: 15.8 mg/dL (ref 0.0–40.0)

## 2021-10-06 LAB — TSH: TSH: 3.76 u[IU]/mL (ref 0.35–5.50)
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
## 2021-10-08 NOTE — Progress Notes (Signed)
All of your labs are within normal range! Glucose (sugar), electrolytes, kidney and liver function, blood count, thyroid, and cholesterol numbers are all good.   Keep up the good work with controlling your diet and continue to try and shoot for 30 minutes of exercise daily!

## 2021-11-09 LAB — HM PAP SMEAR

## 2021-11-29 ENCOUNTER — Encounter: Payer: Self-pay | Admitting: *Deleted

## 2021-12-23 ENCOUNTER — Other Ambulatory Visit: Payer: Self-pay | Admitting: *Deleted

## 2021-12-23 DIAGNOSIS — N979 Female infertility, unspecified: Secondary | ICD-10-CM

## 2022-02-17 ENCOUNTER — Encounter: Payer: Self-pay | Admitting: *Deleted

## 2022-02-21 ENCOUNTER — Ambulatory Visit
Admission: RE | Admit: 2022-02-21 | Discharge: 2022-02-21 | Disposition: A | Discharge status: Home / Self Care | Payer: Managed Care, Other (non HMO) | Source: Ambulatory Visit | Attending: *Deleted | Admitting: *Deleted

## 2022-02-21 DIAGNOSIS — N979 Female infertility, unspecified: Secondary | ICD-10-CM

## 2022-03-09 ENCOUNTER — Encounter: Payer: Self-pay | Admitting: Family

## 2022-05-17 ENCOUNTER — Encounter: Payer: Self-pay | Admitting: Physician Assistant

## 2022-05-17 ENCOUNTER — Ambulatory Visit: Payer: Managed Care, Other (non HMO) | Admitting: Physician Assistant

## 2022-05-17 VITALS — BP 118/88 | HR 78 | Temp 97.7°F | Resp 16 | Ht 65.0 in | Wt 255.0 lb

## 2022-05-17 DIAGNOSIS — J029 Acute pharyngitis, unspecified: Secondary | ICD-10-CM

## 2022-05-17 LAB — POCT RAPID STREP A (OFFICE): Rapid Strep A Screen: POSITIVE — AB

## 2022-05-17 MED ORDER — FLUTICASONE PROPIONATE 50 MCG/ACT NA SUSP: 2.0000 | Freq: Every day | NASAL | 5 refills | Status: AC

## 2022-05-17 MED ORDER — AMOXICILLIN 875 MG PO TABS: 875.0000 mg | ORAL_TABLET | Freq: Two times a day (BID) | ORAL | 0 refills | Status: AC

## 2022-05-17 NOTE — Progress Notes (Signed)
Samantha Shea is a 32 y.o. female here for a new problem.  History of Present Illness:   Chief Complaint  Patient presents with   Ear Pain    Left ear pain that started yesterday     HPI  Ear pain and Sore throat Sx started Saturday -- she had sore throat initially Felt off Sunday Yesterday woke up with temple and left ear pain -- symptoms have persisted Took Sudafed last night; took Flonase as she usually does for allergies Denies fevers, chills, loss of hearing, inability to swallow, poor appetite, diarrhea   Past Medical History:  Diagnosis Date   Anxiety    Chicken pox    Frequent headaches    Migraines      Social History   Tobacco Use   Smoking status: Never   Smokeless tobacco: Never  Vaping Use   Vaping Use: Never used  Substance Use Topics   Alcohol use: Yes    Comment: occasional   Drug use: Yes    Past Surgical History:  Procedure Laterality Date   gum graft  09/2018 01/2019   KNEE SURGERY      Family History  Problem Relation Age of Onset   Asthma Mother    Early death Mother    COPD Mother    Heart disease Mother    Miscarriages / [place] Mother    Alcohol abuse Father    Drug abuse Father    Hypertension Father    Miscarriages / Stillbirths Sister    Cancer Maternal Grandmother    Heart attack Maternal Grandfather    Stroke Paternal Grandmother    Heart disease Paternal 27 / Stillbirths Sister     No Known Allergies  Current Medications:   Current Outpatient Medications:    cholecalciferol (VITAMIN D) 1000 units tablet, Take 2,000 Units by mouth daily., Disp: , Rfl:    fluticasone (FLONASE) 50 MCG/ACT nasal spray, Place 2 sprays into both nostrils daily., Disp: 48 g, Rfl: 1   Prenatal Vit-Fe Fumarate-FA (PRENATAL MULTIVITAMIN) TABS tablet, Take 1 tablet by mouth daily at 12 noon., Disp: , Rfl:    Review of Systems:   ROS Negative unless otherwise specified per HPI.  Vitals:   Vitals:    05/17/22 1043  BP: 118/88  Pulse: 78  Resp: 16  Temp: 97.7 F (36.5 C)  TempSrc: Temporal  SpO2: 97%  Weight: 255 lb (115.7 kg)  Height: '5\' 5"'$  (1.651 m)     Body mass index is 42.43 kg/m.  Physical Exam:   Physical Exam Vitals and nursing note reviewed.  Constitutional:      General: She is not in acute distress.    Appearance: She is well-developed. She is not ill-appearing or toxic-appearing.  HENT:     Head: Normocephalic and atraumatic.     Right Ear: Ear canal and external ear normal. No middle ear effusion. Tympanic membrane is not erythematous, retracted or bulging.     Left Ear: Ear canal and external ear normal. A middle ear effusion is present. Tympanic membrane is not erythematous, retracted or bulging.     Nose: Nose normal.     Right Sinus: No maxillary sinus tenderness or frontal sinus tenderness.     Left Sinus: No maxillary sinus tenderness or frontal sinus tenderness.     Mouth/Throat:     Pharynx: Uvula midline. Posterior oropharyngeal erythema present.     Tonsils: No tonsillar exudate. 1+ on the right. 1+ on the  left.  Eyes:     General: Lids are normal.     Conjunctiva/sclera: Conjunctivae normal.  Neck:     Trachea: Trachea normal.  Cardiovascular:     Rate and Rhythm: Normal rate and regular rhythm.     Pulses: Normal pulses.     Heart sounds: Normal heart sounds, S1 normal and S2 normal.  Pulmonary:     Effort: Pulmonary effort is normal.     Breath sounds: Normal breath sounds. No decreased breath sounds, wheezing, rhonchi or rales.  Lymphadenopathy:     Cervical: No cervical adenopathy.  Skin:    General: Skin is warm and dry.  Neurological:     Mental Status: She is alert.     GCS: GCS eye subscore is 4. GCS verbal subscore is 5. GCS motor subscore is 6.  Psychiatric:        Speech: Speech normal.        Behavior: Behavior normal. Behavior is cooperative.    Results for orders placed or performed in visit on 05/17/22  POCT rapid  strep A  Result Value Ref Range   Rapid Strep A Screen Positive (A) Negative     Assessment and Plan:   Sore throat No red flags on exam.  Will initiate amoxicillin per orders. Appears to have a left ear effusion. Discussed taking medications as prescribed. Reviewed return precautions including worsening fever, SOB, worsening cough or other concerns. Push fluids and rest. I recommend that patient follow-up if symptoms worsen or persist despite treatment x 7-10 days, sooner if needed.   Inda Coke, PA-C

## 2022-05-17 NOTE — Patient Instructions (Signed)
It was great to see you!  Strep test is positive  Start amoxicillin -- this will also help for brewing ear infection  Ibuprofen as needed for pain  Follow-up if any new/worsening symptoms  Take care,  Inda Coke PA-C

## 2022-10-09 NOTE — Progress Notes (Unsigned)
Phone [phone]  Subjective:   Patient is a 32 y.o. female presenting for annual physical.    No chief complaint on file.   See problem oriented charting- ROS- full  review of systems was completed and negative   The following were reviewed and entered/updated in epic: Past Medical History:  Diagnosis Date   Anxiety    Chicken pox    Frequent headaches    Migraines    Patient Active Problem List   Diagnosis Date Noted   Irregular menses 01/03/2021   Encounter to establish care with new doctor 12/21/2020   Vitamin D deficiency 12/01/2017   GAD (generalized anxiety disorder) 11/30/2017   Concussion 11/28/2017   Past Surgical History:  Procedure Laterality Date   gum graft  09/2018 01/2019   KNEE SURGERY      Family History  Problem Relation Age of Onset   Asthma Mother    Early death Mother    COPD Mother    Heart disease Mother    Miscarriages / [place] Mother    Alcohol abuse Father    Drug abuse Father    Hypertension Father    Miscarriages / Stillbirths Sister    Cancer Maternal Grandmother    Heart attack Maternal Grandfather    Stroke Paternal Grandmother    Heart disease Paternal Grandfather    Miscarriages / [place] Sister     Medications- reviewed and updated Current Outpatient Medications  Medication Sig Dispense Refill   cholecalciferol (VITAMIN D) 1000 units tablet Take 2,000 Units by mouth daily.     fluticasone (FLONASE) 50 MCG/ACT nasal spray Place 2 sprays into both nostrils daily. 15.8 mL 5   Prenatal Vit-Fe Fumarate-FA (PRENATAL MULTIVITAMIN) TABS tablet Take 1 tablet by mouth daily at 12 noon.     No current facility-administered medications for this visit.    Allergies-reviewed and updated No Known Allergies  Social History   Social History Narrative   Not on file    Objective:  There were no vitals taken for this visit. Physical Exam Vitals and nursing note reviewed.  Constitutional:      Appearance: Normal  appearance.  HENT:     Head: Normocephalic.     Right Ear: Tympanic membrane normal.     Left Ear: Tympanic membrane normal.     Nose: Nose normal.     Mouth/Throat:     Mouth: Mucous membranes are moist.  Eyes:     Pupils: Pupils are equal, round, and reactive to light.  Cardiovascular:     Rate and Rhythm: Normal rate and regular rhythm.  Pulmonary:     Effort: Pulmonary effort is normal.     Breath sounds: Normal breath sounds.  Musculoskeletal:        General: Normal range of motion.     Cervical back: Normal range of motion.  Lymphadenopathy:     Cervical: No cervical adenopathy.  Skin:    General: Skin is warm and dry.  Neurological:     Mental Status: She is alert.  Psychiatric:        Mood and Affect: Mood normal.        Behavior: Behavior normal.      Assessment and Plan   Health Maintenance counseling: 1. Anticipatory guidance: Patient counseled regarding regular dental exams q6 months, eye exams,  avoiding smoking and second hand smoke, limiting alcohol to 1 beverage per day, no illicit drugs.   2. Risk factor reduction:  Advised patient of need for regular exercise  and diet rich with fruits and vegetables to reduce risk of heart attack and stroke. Wt Readings from Last 3 Encounters:  05/17/22 255 lb (115.7 kg)  10/06/21 243 lb (110.2 kg)  12/21/20 234 lb 9.6 oz (106.4 kg)   3. Immunizations/screenings/ancillary studies  There is no immunization history on file for this patient. Health Maintenance Due  Topic Date Due   DTaP/Tdap/Td (1 - Tdap) Never done   PAP SMEAR-Modifier  08/15/2020   COVID-19 Vaccine (1 - 2023-24 season) Never done   INFLUENZA VACCINE  10/06/2022    4. Cervical cancer screening: due, pt has appt with OB soon 5. Skin cancer screening- advised regular sunscreen use. Denies worrisome, changing, or new skin lesions.  6. Birth control/STD check: N/A - married, trying to get pregnant 7. Smoking associated screening: non- smoker 8.  Alcohol screening: rare  Problem List Items Addressed This Visit   None   Recommended follow up: No follow-ups on file. Future Appointments  Date Time Provider Department Center  10/10/2022  8:00 AM Dulce Sellar, NP [facility]    Lab/Order associations:fasting    Dulce Sellar, NP

## 2022-10-09 NOTE — Patient Instructions (Incomplete)
It was very nice to see you today!   I will review your lab results via MyChart in a few days.       PLEASE NOTE:  If you had any lab tests please let us know if you have not heard back within a few days. You may see your results on MyChart before we have a chance to review them but we will give you a call once they are reviewed by us. If we ordered any referrals today, please let us know if you have not heard from their office within the next week.    

## 2022-10-10 ENCOUNTER — Ambulatory Visit (INDEPENDENT_AMBULATORY_CARE_PROVIDER_SITE_OTHER): Payer: Managed Care, Other (non HMO) | Admitting: Family

## 2022-10-10 ENCOUNTER — Encounter: Payer: Self-pay | Admitting: Family

## 2022-10-10 VITALS — BP 121/80 | HR 80 | Temp 97.5°F | Ht 65.0 in | Wt 245.6 lb

## 2022-10-10 DIAGNOSIS — Z0000 Encounter for general adult medical examination without abnormal findings: Secondary | ICD-10-CM

## 2022-10-10 DIAGNOSIS — E66813 Obesity, class 3: Secondary | ICD-10-CM

## 2022-10-10 DIAGNOSIS — Z23 Encounter for immunization: Secondary | ICD-10-CM

## 2022-10-10 DIAGNOSIS — Z6841 Body Mass Index (BMI) 40.0 and over, adult: Secondary | ICD-10-CM | POA: Diagnosis not present

## 2022-10-10 HISTORY — DX: Obesity, class 3: E66.813

## 2022-10-10 LAB — CBC WITH DIFFERENTIAL/PLATELET
Basophils Absolute: 0.1 10*3/uL (ref 0.0–0.1)
Basophils Relative: 0.7 % (ref 0.0–3.0)
Eosinophils Absolute: 0.3 10*3/uL (ref 0.0–0.7)
Eosinophils Relative: 3.8 % (ref 0.0–5.0)
HCT: 44.4 % (ref 36.0–46.0)
Hemoglobin: 14.8 g/dL (ref 12.0–15.0)
Lymphocytes Relative: 31.1 % (ref 12.0–46.0)
Lymphs Abs: 2.2 10*3/uL (ref 0.7–4.0)
MCHC: 33.4 g/dL (ref 30.0–36.0)
MCV: 92.2 fl (ref 78.0–100.0)
Monocytes Absolute: 0.5 10*3/uL (ref 0.1–1.0)
Monocytes Relative: 7.3 % (ref 3.0–12.0)
Neutro Abs: 4.1 10*3/uL (ref 1.4–7.7)
Neutrophils Relative %: 57.1 % (ref 43.0–77.0)
Platelets: 252 10*3/uL (ref 150.0–400.0)
RBC: 4.82 Mil/uL (ref 3.87–5.11)
RDW: 13.1 % (ref 11.5–15.5)
WBC: 7.1 10*3/uL (ref 4.0–10.5)

## 2022-10-10 LAB — COMPREHENSIVE METABOLIC PANEL
ALT: 34 U/L (ref 0–35)
AST: 20 U/L (ref 0–37)
Albumin: 4.6 g/dL (ref 3.5–5.2)
Alkaline Phosphatase: 49 U/L (ref 39–117)
BUN: 15 mg/dL (ref 6–23)
CO2: 24 mEq/L (ref 19–32)
Calcium: 9.2 mg/dL (ref 8.4–10.5)
Chloride: 104 mEq/L (ref 96–112)
Creatinine, Ser: 0.76 mg/dL (ref 0.40–1.20)
GFR: 104.91 mL/min (ref >60.00)
Glucose, Bld: 98 mg/dL (ref 70–99)
Potassium: 4.1 mEq/L (ref 3.5–5.1)
Sodium: 137 mEq/L (ref 135–145)
Total Bilirubin: 0.4 mg/dL (ref 0.2–1.2)
Total Protein: 7.4 g/dL (ref 6.0–8.3)

## 2022-10-10 NOTE — Assessment & Plan Note (Signed)
chronic pt has lost 10lbs in 52m with cutting calories, reducing sweets continue to encourage Wt. Loss strategies reviewed including portion control, less carbs including sweets, eating most of calories earlier in day, drinking 64oz water qd, and establishing daily exercise routine.

## 2022-11-25 ENCOUNTER — Encounter: Payer: Self-pay | Admitting: Family

## 2022-12-05 ENCOUNTER — Telehealth: Payer: Managed Care, Other (non HMO) | Admitting: Family Medicine

## 2022-12-05 DIAGNOSIS — J40 Bronchitis, not specified as acute or chronic: Secondary | ICD-10-CM | POA: Diagnosis not present

## 2022-12-05 DIAGNOSIS — B9689 Other specified bacterial agents as the cause of diseases classified elsewhere: Secondary | ICD-10-CM

## 2022-12-05 DIAGNOSIS — J019 Acute sinusitis, unspecified: Secondary | ICD-10-CM | POA: Diagnosis not present

## 2022-12-05 MED ORDER — ALBUTEROL SULFATE HFA 108 (90 BASE) MCG/ACT IN AERS: 1.0000 | INHALATION_SPRAY | Freq: Four times a day (QID) | RESPIRATORY_TRACT | 0 refills | Status: DC | PRN

## 2022-12-05 MED ORDER — AMOXICILLIN-POT CLAVULANATE 875-125 MG PO TABS: 1.0000 | ORAL_TABLET | Freq: Two times a day (BID) | ORAL | 0 refills | Status: AC

## 2022-12-05 NOTE — Progress Notes (Signed)
E-Visit for Sinus Problems  We are sorry that you are not feeling well.  Here is how we plan to help!  Based on what you have shared with me it looks like you have sinusitis.  Sinusitis is inflammation and infection in the sinus cavities of the head.  Based on your presentation I believe you most likely have Acute Bacterial Sinusitis.  This is an infection caused by bacteria and is treated with antibiotics. I have prescribed Augmentin 875mg /125mg  one tablet twice daily with food, for 7 days. You may use an oral decongestant such as Mucinex D or if you have glaucoma or high blood pressure use plain Mucinex. Saline nasal spray help and can safely be used as often as needed for congestion.  If you develop worsening sinus pain, fever or notice severe headache and vision changes, or if symptoms are not better after completion of antibiotic, please schedule an appointment with a health care provider.    I will also order you an inhaler to help with the cough and wheezing you are having. Use as directed.  Sinus infections are not as easily transmitted as other respiratory infection, however we still recommend that you avoid close contact with loved ones, especially the very young and elderly.  Remember to wash your hands thoroughly throughout the day as this is the number one way to prevent the spread of infection!  Home Care: Only take medications as instructed by your medical team. Complete the entire course of an antibiotic. Do not take these medications with alcohol. A steam or ultrasonic humidifier can help congestion.  You can place a towel over your head and breathe in the steam from hot water coming from a faucet. Avoid close contacts especially the very young and the elderly. Cover your mouth when you cough or sneeze. Always remember to wash your hands.  Get Help Right Away If: You develop worsening fever or sinus pain. You develop a severe head ache or visual changes. Your symptoms persist  after you have completed your treatment plan.  Make sure you Understand these instructions. Will watch your condition. Will get help right away if you are not doing well or get worse.  Thank you for choosing an e-visit.  Your e-visit answers were reviewed by a board certified advanced clinical practitioner to complete your personal care plan. Depending upon the condition, your plan could have included both over the counter or prescription medications.  Please review your pharmacy choice. Make sure the pharmacy is open so you can pick up prescription now. If there is a problem, you may contact your provider through [employer] and have the prescription routed to another pharmacy.  Your safety is important to [place]. If you have drug allergies check your prescription carefully.   For the next 24 hours you can use MyChart to ask questions about today's visit, request a non-urgent call back, or ask for a work or school excuse. You will get an email in the next two days asking about your experience. I hope that your e-visit has been valuable and will speed your recovery.  I provided 5 minutes of non face-to-face time during this encounter for chart review, medication and order placement, as well as and documentation.

## 2023-01-09 ENCOUNTER — Ambulatory Visit: Payer: Managed Care, Other (non HMO) | Admitting: Family

## 2023-01-09 ENCOUNTER — Encounter: Payer: Self-pay | Admitting: Family

## 2023-01-09 VITALS — BP 130/83 | HR 67 | Temp 98.0°F | Ht 65.0 in | Wt 238.0 lb

## 2023-01-09 DIAGNOSIS — J020 Streptococcal pharyngitis: Secondary | ICD-10-CM

## 2023-01-09 DIAGNOSIS — J069 Acute upper respiratory infection, unspecified: Secondary | ICD-10-CM

## 2023-01-09 LAB — POCT INFLUENZA A/B
Influenza A, POC: NEGATIVE
Influenza B, POC: NEGATIVE

## 2023-01-09 LAB — POC COVID19 BINAXNOW: SARS Coronavirus 2 Ag: NEGATIVE

## 2023-01-09 LAB — POCT RAPID STREP A (OFFICE): Rapid Strep A Screen: POSITIVE — AB

## 2023-01-09 MED ORDER — AMOXICILLIN 500 MG PO CAPS: 500.0000 mg | ORAL_CAPSULE | Freq: Two times a day (BID) | ORAL | 0 refills | Status: AC

## 2023-01-09 NOTE — Progress Notes (Signed)
Patient ID: Samantha Shea, female    DOB: [birth year], 32 y.o.   MRN: [id number]  Chief Complaint  Patient presents with   Sore Throat    Pt c/o sore throat and fever of 99.2, headaches, Nasal congestion and bilateral ear pain since Thursday. Has tried nyquil      Discussed the use of AI scribe software for clinical note transcription with the patient, who gave verbal consent to proceed.  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 She describes her lymph nodes as 'throbbing' and has ear pain. She has been taking Nyquil for symptom relief. She has a history of strep throat, with the last episode occurring the previous year. She also mentions feeling 'puffy' around her throat more than usual.     Assessment & Plan:     Strep throat -  Throat pain with redness and lymphadenopathy. History of strep throat. No other symptoms. -Start Amoxicillin 500mg  twice daily for 10 days. -Advise Ibuprofen 600mg  tid or 1-2 tabs of generic Aleve bid as needed for pain and inflammation. -Warm salt water gargles/spit tid until pain resolved. -RTO office precautions given.   Right otitis media -  Right ear with erythema, no bulging. Preauricular lymphadenopathy. -Sending AMOX above for strep, should help ear symptoms as well. -Advised on above dosing of Ibuprofen or Aleve to help with ear pain also. -RTO office precautions given.      Subjective:    Outpatient Medications Prior to Visit  Medication Sig Dispense Refill   cholecalciferol (VITAMIN D) 1000 units tablet Take 2,000 Units by mouth daily.     fluticasone (FLONASE) 50 MCG/ACT nasal spray Place 2 sprays into both nostrils daily. 15.8 mL 5   Prenatal Vit-Fe Fumarate-FA (PRENATAL MULTIVITAMIN) TABS tablet Take 1 tablet by mouth daily at 12 noon.     albuterol (VENTOLIN HFA) 108 (90 Base) MCG/ACT inhaler Inhale 1-2 puffs into the lungs every 6  (six) hours as needed for wheezing or shortness of breath (cough). 8 g 0   No facility-administered medications prior to visit.   Past Medical History:  Diagnosis Date   Anxiety    Chicken pox    Frequent headaches    Migraines    Past Surgical History:  Procedure Laterality Date   gum graft  09/2018 01/2019   KNEE SURGERY     No Known Allergies    Objective:    Physical Exam Vitals and nursing note reviewed.  Constitutional:      Appearance: Normal appearance.  HENT:     Right Ear: Ear canal normal. Tympanic membrane is erythematous.     Left Ear: Tympanic membrane and ear canal normal.     Nose: No congestion or rhinorrhea.     Mouth/Throat:     Mouth: Mucous membranes are moist.     Pharynx: Pharyngeal swelling and posterior oropharyngeal erythema present. No oropharyngeal exudate or uvula swelling.     Tonsils: No tonsillar exudate or tonsillar abscesses.  Cardiovascular:     Rate and Rhythm: Normal rate and regular rhythm.  Pulmonary:     Effort: Pulmonary effort is normal.     Breath sounds: Normal breath sounds.  Musculoskeletal:        General: Normal range of motion.  Lymphadenopathy:     Head:     Right side of head: Submandibular, tonsillar and preauricular adenopathy present. No posterior auricular or  occipital adenopathy.     Left side of head: Submandibular, tonsillar and preauricular adenopathy present. No posterior auricular or occipital adenopathy.     Cervical: Cervical adenopathy present.     Right cervical: Superficial cervical adenopathy present.     Left cervical: Superficial cervical adenopathy present.  Skin:    General: Skin is warm and dry.  Neurological:     Mental Status: She is alert.  Psychiatric:        Mood and Affect: Mood normal.        Behavior: Behavior normal.    BP 130/83 (BP Location: Left Arm, Patient Position: Sitting, Cuff Size: Normal)   Pulse 67   Temp 98 F (36.7 C) (Temporal)   Ht 5\' 5"  (1.651 m)   Wt 238 lb (108  kg)   LMP  (LMP Unknown)   SpO2 98%   BMI 39.61 kg/m  Wt Readings from Last 3 Encounters:  01/09/23 238 lb (108 kg)  10/10/22 245 lb 9.6 oz (111.4 kg)  05/17/22 255 lb (115.7 kg)       Dulce Sellar, NP

## 2023-02-06 ENCOUNTER — Ambulatory Visit: Payer: Managed Care, Other (non HMO) | Admitting: Family

## 2023-02-06 ENCOUNTER — Encounter: Payer: Self-pay | Admitting: Family

## 2023-02-06 VITALS — BP 128/80 | HR 68 | Temp 97.7°F | Ht 65.0 in | Wt 244.6 lb

## 2023-02-06 DIAGNOSIS — H9201 Otalgia, right ear: Secondary | ICD-10-CM

## 2023-02-06 MED ORDER — NAPROXEN 500 MG PO TABS: 500.0000 mg | ORAL_TABLET | Freq: Two times a day (BID) | ORAL | 0 refills | Status: DC

## 2023-02-06 NOTE — Telephone Encounter (Signed)
See patient MyChart message; 1:30 opening today and scheduled for patient.

## 2023-02-06 NOTE — Progress Notes (Signed)
Patient ID: Samantha Shea, female    DOB: 1991-03-01, 32 y.o.   MRN: [id number]  Chief Complaint  Patient presents with   Ear Pain    Right ear pain for a little over a week    Discussed the use of AI scribe software for clinical note transcription with the patient, who gave verbal consent to proceed.  History of Present Illness   The patient presents with persistent right ear pain and discomfort at the back of the tongue, which started a few days after completing antibiotics for strep throat. The pain is constant and sometimes radiates to the tongue, making it difficult to move around the mouth. The patient denies difficulty swallowing, pain with jaw movement, or difficulty chewing. She has been taking ibuprofen for the past week and a half for the pain, which provides temporary relief. The patient was previously treated with amoxicillin for ten days for strep throat.    Assessment & Plan:     Ear Pain - Persistent unilateral right ear pain and tongue pain following recent strep throat infection. No signs of current infection, but milky white substance noted on edge of TM, but w/o erythema or bulging on examination. Possible lingering lymph node inflammation aggravating pain. -Discontinue ibuprofen due to potential stomach issues. -Sending prescription strength Aleve, twice daily as needed for the next 4-5 days. -Reassess pain after 4 days and contact office if pain persists.     Subjective:    Outpatient Medications Prior to Visit  Medication Sig Dispense Refill   cholecalciferol (VITAMIN D) 1000 units tablet Take 2,000 Units by mouth daily.     fluticasone (FLONASE) 50 MCG/ACT nasal spray Place 2 sprays into both nostrils daily. 15.8 mL 5   Prenatal Vit-Fe Fumarate-FA (PRENATAL MULTIVITAMIN) TABS tablet Take 1 tablet by mouth daily at 12 noon.     No facility-administered medications prior to visit.   Past Medical History:  Diagnosis Date   Anxiety    Chicken pox     Frequent headaches    Migraines    Past Surgical History:  Procedure Laterality Date   gum graft  09/2018 01/2019   KNEE SURGERY     No Known Allergies    Objective:    Physical Exam Vitals and nursing note reviewed.  Constitutional:      Appearance: Normal appearance. She is obese.  HENT:     Right Ear: Drainage (thin layer of white liquid on edge of TM noted) present. No tenderness. No middle ear effusion. Tympanic membrane is injected (mildly). Tympanic membrane is not erythematous or bulging.     Left Ear: Tympanic membrane normal.  Cardiovascular:     Rate and Rhythm: Normal rate and regular rhythm.  Pulmonary:     Effort: Pulmonary effort is normal.     Breath sounds: Normal breath sounds.  Musculoskeletal:        General: Normal range of motion.  Skin:    General: Skin is warm and dry.  Neurological:     Mental Status: She is alert.  Psychiatric:        Mood and Affect: Mood normal.        Behavior: Behavior normal.    BP 128/80   Pulse 68   Temp 97.7 F (36.5 C) (Temporal)   Ht 5\' 5"  (1.651 m)   Wt 244 lb 9.6 oz (110.9 kg)   LMP 01/04/2023   SpO2 98%   BMI 40.70 kg/m  Wt Readings from Last 3 Encounters:  02/06/23 244 lb 9.6 oz (110.9 kg)  01/09/23 238 lb (108 kg)  10/10/22 245 lb 9.6 oz (111.4 kg)      Dulce Sellar, NP

## 2023-02-09 ENCOUNTER — Telehealth: Payer: Self-pay

## 2023-02-09 NOTE — Telephone Encounter (Signed)
-----   Message from [place] sent at 02/06/2023  5:05 PM EST ----- Regarding: ear pain Call Cale and see how she is doing, does her right ear still hurt? let me know, thx.

## 2023-02-09 NOTE — Telephone Encounter (Signed)
I called pt and LVM in regards.  

## 2023-09-27 ENCOUNTER — Other Ambulatory Visit: Payer: Self-pay | Admitting: Obstetrics and Gynecology

## 2023-10-10 ENCOUNTER — Encounter: Payer: Self-pay | Admitting: Family

## 2023-10-10 NOTE — Progress Notes (Unsigned)
 Phone [phone]  Subjective:   Patient is a 33 y.o. female presenting for annual physical.    No chief complaint on file.   See problem oriented charting- ROS- full  review of systems was completed and negative   The following were reviewed and entered/updated in epic: Past Medical History:  Diagnosis Date   Anxiety    Chicken pox    Concussion 11/28/2017   Frequent headaches    Migraines    Patient Active Problem List   Diagnosis Date Noted   Class 3 severe obesity due to excess calories without serious comorbidity with body mass index (BMI) of 40.0 to 44.9 in adult 10/10/2022   Irregular menses 01/03/2021   Vitamin D  deficiency 12/01/2017   GAD (generalized anxiety disorder) 11/30/2017   Past Surgical History:  Procedure Laterality Date   gum graft  09/2018 01/2019   KNEE SURGERY      Family History  Problem Relation Age of Onset   Asthma Mother    Early death Mother    COPD Mother    Heart disease Mother    Miscarriages / [place] Mother    Alcohol abuse Father    Drug abuse Father    Hypertension Father    Miscarriages / Stillbirths Sister    Cancer Maternal Grandmother    Heart attack Maternal Grandfather    Stroke Paternal Grandmother    Heart disease Paternal Grandfather    Miscarriages / [place] Sister     Medications- reviewed and updated Current Outpatient Medications  Medication Sig Dispense Refill   cholecalciferol (VITAMIN D ) 1000 units tablet Take 2,000 Units by mouth daily.     fluticasone  (FLONASE ) 50 MCG/ACT nasal spray Place 2 sprays into both nostrils daily. 15.8 mL 5   naproxen  (NAPROSYN ) 500 MG tablet Take 1 tablet (500 mg total) by mouth 2 (two) times daily with a meal. For pain. 30 tablet 0   Prenatal Vit-Fe Fumarate-FA (PRENATAL MULTIVITAMIN) TABS tablet Take 1 tablet by mouth daily at 12 noon.     No current facility-administered medications for this visit.    Allergies-reviewed and updated No Known  Allergies  Social History   Social History Narrative   Not on file    Objective:  There were no vitals taken for this visit. Physical Exam Vitals and nursing note reviewed.  Constitutional:      Appearance: Normal appearance.  HENT:     Head: Normocephalic.     Right Ear: Tympanic membrane normal.     Left Ear: Tympanic membrane normal.     Nose: Nose normal.     Mouth/Throat:     Mouth: Mucous membranes are moist.  Eyes:     Pupils: Pupils are equal, round, and reactive to light.  Cardiovascular:     Rate and Rhythm: Normal rate and regular rhythm.  Pulmonary:     Effort: Pulmonary effort is normal.     Breath sounds: Normal breath sounds.  Musculoskeletal:        General: Normal range of motion.     Cervical back: Normal range of motion.  Lymphadenopathy:     Cervical: No cervical adenopathy.  Skin:    General: Skin is warm and dry.  Neurological:     Mental Status: She is alert.  Psychiatric:        Mood and Affect: Mood normal.        Behavior: Behavior normal.     Assessment and Plan   Health Maintenance counseling: 1. Anticipatory guidance:  Patient counseled regarding regular dental exams q6 months, eye exams,  avoiding smoking and second hand smoke, limiting alcohol to 1 beverage per day, no illicit drugs.   2. Risk factor reduction:  Advised patient of need for regular exercise and diet rich with fruits and vegetables to reduce risk of heart attack and stroke. Wt Readings from Last 3 Encounters:  02/06/23 244 lb 9.6 oz (110.9 kg)  01/09/23 238 lb (108 kg)  10/10/22 245 lb 9.6 oz (111.4 kg)   3. Immunizations/screenings/ancillary studies Immunization History  Administered Date(s) Administered   Tdap 02/20/2012, 10/10/2022   Health Maintenance Due  Topic Date Due   Hepatitis B Vaccines (1 of 3 - 19+ 3-dose series) Never done   HPV VACCINES (1 - 3-dose SCDM series) Never done   INFLUENZA VACCINE  10/06/2023    4. Cervical cancer screening: done  2023, normal  5. Skin cancer screening- advised regular sunscreen use. Denies worrisome, changing, or new skin lesions.  6. Birth control/STD check: N/A - married, trying to get pregnant 7. Smoking associated screening: non- smoker 8. Alcohol screening: 1-2/week  Problem List Items Addressed This Visit   None   Recommended follow up: No follow-ups on file. Future Appointments  Date Time Provider Department Center  10/11/2023  8:00 AM Lucius Krabbe, NP [facility]     Lab/Order associations: fasting    Lucius Krabbe, NP

## 2023-10-11 ENCOUNTER — Ambulatory Visit: Payer: Managed Care, Other (non HMO) | Admitting: Family

## 2023-10-11 VITALS — BP 126/82 | HR 73 | Temp 97.3°F | Ht 65.0 in | Wt 233.0 lb

## 2023-10-11 DIAGNOSIS — N979 Female infertility, unspecified: Secondary | ICD-10-CM | POA: Diagnosis not present

## 2023-10-11 DIAGNOSIS — E669 Obesity, unspecified: Secondary | ICD-10-CM | POA: Insufficient documentation

## 2023-10-11 DIAGNOSIS — Z0000 Encounter for general adult medical examination without abnormal findings: Secondary | ICD-10-CM

## 2023-10-11 DIAGNOSIS — E282 Polycystic ovarian syndrome: Secondary | ICD-10-CM

## 2023-10-11 DIAGNOSIS — N7011 Chronic salpingitis: Secondary | ICD-10-CM | POA: Diagnosis not present

## 2023-10-11 DIAGNOSIS — Z13220 Encounter for screening for lipoid disorders: Secondary | ICD-10-CM | POA: Diagnosis not present

## 2023-10-11 LAB — CBC WITH DIFFERENTIAL/PLATELET
Basophils Absolute: 0 K/uL (ref 0.0–0.1)
Basophils Relative: 0.5 % (ref 0.0–3.0)
Eosinophils Absolute: 0.3 K/uL (ref 0.0–0.7)
Eosinophils Relative: 4.4 % (ref 0.0–5.0)
HCT: 41.6 % (ref 36.0–46.0)
Hemoglobin: 14.1 g/dL (ref 12.0–15.0)
Lymphocytes Relative: 23.4 % (ref 12.0–46.0)
Lymphs Abs: 1.6 K/uL (ref 0.7–4.0)
MCHC: 33.9 g/dL (ref 30.0–36.0)
MCV: 90.9 fl (ref 78.0–100.0)
Monocytes Absolute: 0.5 K/uL (ref 0.1–1.0)
Monocytes Relative: 6.5 % (ref 3.0–12.0)
Neutro Abs: 4.6 K/uL (ref 1.4–7.7)
Neutrophils Relative %: 65.2 % (ref 43.0–77.0)
Platelets: 235 K/uL (ref 150.0–400.0)
RBC: 4.58 Mil/uL (ref 3.87–5.11)
RDW: 12.6 % (ref 11.5–15.5)
WBC: 7 K/uL (ref 4.0–10.5)

## 2023-10-11 LAB — COMPREHENSIVE METABOLIC PANEL WITH GFR
ALT: 28 U/L (ref 0–35)
AST: 16 U/L (ref 0–37)
Albumin: 4.2 g/dL (ref 3.5–5.2)
Alkaline Phosphatase: 43 U/L (ref 39–117)
BUN: 12 mg/dL (ref 6–23)
CO2: 27 meq/L (ref 19–32)
Calcium: 9.2 mg/dL (ref 8.4–10.5)
Chloride: 102 meq/L (ref 96–112)
Creatinine, Ser: 0.64 mg/dL (ref 0.40–1.20)
GFR: 117.49 mL/min (ref >60.00)
Glucose, Bld: 94 mg/dL (ref 70–99)
Potassium: 3.9 meq/L (ref 3.5–5.1)
Sodium: 137 meq/L (ref 135–145)
Total Bilirubin: 0.3 mg/dL (ref 0.2–1.2)
Total Protein: 6.9 g/dL (ref 6.0–8.3)

## 2023-10-11 LAB — TSH: TSH: 2.35 u[IU]/mL (ref 0.35–5.50)

## 2023-10-11 LAB — LIPID PANEL
Cholesterol: 129 mg/dL (ref 0–200)
HDL: 36.6 mg/dL — ABNORMAL LOW (ref >39.00)
LDL Cholesterol: 72 mg/dL (ref 0–99)
NonHDL: 92.55
Total CHOL/HDL Ratio: 4
Triglycerides: 104 mg/dL (ref 0.0–149.0)
VLDL: 20.8 mg/dL (ref 0.0–40.0)

## 2023-10-11 NOTE — Assessment & Plan Note (Signed)
 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 No pain or fever. Surgery scheduled for January 24, 2024. - Proceed with scheduled laparoscopic surgery on January 24, 2024.

## 2023-10-11 NOTE — Assessment & Plan Note (Signed)
 PCOS managed with metformin. Diarrhea reported as a side effect. Discussed switching to extended-release metformin to reduce gastrointestinal side effects. - Discuss with fertility doctor about switching to extended-release metformin if diarrhea persists.

## 2023-10-11 NOTE — Patient Instructions (Signed)
 It was very nice to see you today!   I will review your lab results via MyChart in a few days.  Good luck with your upcoming surgery and fertility visits!  Stay well, and increase cardio exercise as many days as you can 20-30 minutes!      PLEASE NOTE:  If you had any lab tests please let us  know if you have not heard back within a few days. You may see your results on MyChart before we have a chance to review them but we will give you a call once they are reviewed by us . If we ordered any referrals today, please let us  know if you have not heard from their office within the next week.

## 2023-10-13 ENCOUNTER — Ambulatory Visit: Payer: Self-pay | Admitting: Family

## 2024-01-22 ENCOUNTER — Encounter (HOSPITAL_COMMUNITY): Payer: Self-pay | Admitting: Obstetrics and Gynecology

## 2024-01-22 NOTE — Progress Notes (Signed)
 Spoke w/ via phone for pre-op interview--- [facility] needs dos----T&S, CBC and RPR per surgeon. UPT per anesthesia.         Lab results------ COVID test -----patient states asymptomatic no test needed Arrive at -------1045 NPO after MN NO Solid Food.  Clear liquids from MN until---0945 Pre-Surgery Ensure or G2:  Med rec completed Medications to take morning of surgery -----NONE Diabetic medication -----  GLP1 agonist last dose: GLP1 instructions:  Patient instructed no nail polish to be worn day of surgery Patient instructed to bring photo id and insurance card day of surgery Patient aware to have Driver (ride ) / caregiver    for 24 hours after surgery - Husband Rankin Funk Patient Special Instructions ----- Pre-Op special Instructions -----  Patient verbalized understanding of instructions that were given at this phone interview. Patient denies chest pain, sob, fever, cough at the interview.

## 2024-01-24 ENCOUNTER — Encounter (HOSPITAL_COMMUNITY)
Admission: RE | Disposition: A | Discharge status: Home / Self Care | Payer: Self-pay | Source: Home / Self Care | Attending: Obstetrics and Gynecology

## 2024-01-24 ENCOUNTER — Other Ambulatory Visit: Payer: Self-pay

## 2024-01-24 ENCOUNTER — Encounter (HOSPITAL_COMMUNITY): Payer: Self-pay | Admitting: Obstetrics and Gynecology

## 2024-01-24 ENCOUNTER — Ambulatory Visit (HOSPITAL_COMMUNITY)

## 2024-01-24 ENCOUNTER — Ambulatory Visit (HOSPITAL_COMMUNITY)
Admission: RE | Admit: 2024-01-24 | Discharge: 2024-01-24 | Disposition: A | Discharge status: Home / Self Care | Attending: Obstetrics and Gynecology | Admitting: Obstetrics and Gynecology

## 2024-01-24 DIAGNOSIS — Z59868 Other specified financial insecurity: Secondary | ICD-10-CM | POA: Diagnosis not present

## 2024-01-24 DIAGNOSIS — N7011 Chronic salpingitis: Secondary | ICD-10-CM | POA: Insufficient documentation

## 2024-01-24 DIAGNOSIS — Z01818 Encounter for other preprocedural examination: Secondary | ICD-10-CM

## 2024-01-24 HISTORY — PX: LAPAROSCOPIC LYSIS OF ADHESIONS: SHX5905

## 2024-01-24 HISTORY — PX: LAPAROSCOPIC UNILATERAL SALPINGECTOMY: SHX5934

## 2024-01-24 HISTORY — DX: Polycystic ovarian syndrome: E28.2

## 2024-01-24 LAB — CBC
HCT: 43.6 % (ref 36.0–46.0)
Hemoglobin: 14.9 g/dL (ref 12.0–15.0)
MCH: 30.4 pg (ref 26.0–34.0)
MCHC: 34.2 g/dL (ref 30.0–36.0)
MCV: 89 fL (ref 80.0–100.0)
Platelets: 261 K/uL (ref 150–400)
RBC: 4.9 MIL/uL (ref 3.87–5.11)
RDW: 12.2 % (ref 11.5–15.5)
WBC: 8.7 K/uL (ref 4.0–10.5)
nRBC: 0 % (ref 0.0–0.2)

## 2024-01-24 LAB — TYPE AND SCREEN
ABO/RH(D): A NEG
Antibody Screen: NEGATIVE

## 2024-01-24 LAB — ABO/RH: ABO/RH(D): A NEG

## 2024-01-24 LAB — POCT PREGNANCY, URINE: Preg Test, Ur: NEGATIVE

## 2024-01-24 SURGERY — LYSIS, ADHESIONS, LAPAROSCOPIC
Anesthesia: General | Laterality: Right

## 2024-01-24 MED ORDER — POVIDONE-IODINE 10 % EX SWAB: 2.0000 | Freq: Once | CUTANEOUS | Status: DC

## 2024-01-24 MED ORDER — MIDAZOLAM HCL 2 MG/2ML IJ SOLN
INTRAMUSCULAR | Status: AC
Start: 2024-01-24 — End: 2024-01-24
  Filled 2024-01-24: qty 2

## 2024-01-24 MED ORDER — LACTATED RINGERS IV SOLN: INTRAVENOUS | Status: DC

## 2024-01-24 MED ORDER — DEXTROSE 5 % IV SOLN: 2000.0000 mg | Freq: Once | INTRAVENOUS | Status: DC

## 2024-01-24 MED ORDER — BUPIVACAINE-EPINEPHRINE 0.25% -1:200000 IJ SOLN
INTRAMUSCULAR | Status: DC | PRN
  Administered 2024-01-24: 7 mL

## 2024-01-24 MED ORDER — LIDOCAINE 2% (20 MG/ML) 5 ML SYRINGE
INTRAMUSCULAR | Status: DC | PRN
Start: 2024-01-24 — End: 2024-01-24
  Administered 2024-01-24: 100 mg via INTRAVENOUS

## 2024-01-24 MED ORDER — SODIUM CHLORIDE (PF) 0.9 % IJ SOLN
INTRAMUSCULAR | Status: AC
  Filled 2024-01-24: qty 100

## 2024-01-24 MED ORDER — FENTANYL CITRATE (PF) 250 MCG/5ML IJ SOLN
INTRAMUSCULAR | Status: DC | PRN
  Administered 2024-01-24 (×4): 50 ug via INTRAVENOUS

## 2024-01-24 MED ORDER — DEXAMETHASONE SOD PHOSPHATE PF 10 MG/ML IJ SOLN
INTRAMUSCULAR | Status: DC | PRN
  Administered 2024-01-24: 10 mg via INTRAVENOUS

## 2024-01-24 MED ORDER — PROPOFOL 10 MG/ML IV BOLUS
INTRAVENOUS | Status: AC
Start: 2024-01-24 — End: 2024-01-24
  Filled 2024-01-24: qty 20

## 2024-01-24 MED ORDER — HYDROMORPHONE HCL 1 MG/ML IJ SOLN
INTRAMUSCULAR | Status: AC
  Filled 2024-01-24: qty 0.5

## 2024-01-24 MED ORDER — SCOPOLAMINE 1 MG/3DAYS TD PT72
MEDICATED_PATCH | TRANSDERMAL | Status: AC
  Filled 2024-01-24: qty 1

## 2024-01-24 MED ORDER — LIDOCAINE 2% (20 MG/ML) 5 ML SYRINGE
INTRAMUSCULAR | Status: AC
Start: 2024-01-24 — End: 2024-01-24
  Filled 2024-01-24: qty 5

## 2024-01-24 MED ORDER — CEFAZOLIN SODIUM-DEXTROSE 2-4 GM/100ML-% IV SOLN
2.0000 g | Freq: Once | INTRAVENOUS | Status: AC
  Administered 2024-01-24: 2 g via INTRAVENOUS

## 2024-01-24 MED ORDER — FENTANYL CITRATE (PF) 250 MCG/5ML IJ SOLN
INTRAMUSCULAR | Status: AC
  Filled 2024-01-24: qty 5

## 2024-01-24 MED ORDER — KETOROLAC TROMETHAMINE 30 MG/ML IJ SOLN
INTRAMUSCULAR | Status: DC | PRN
  Administered 2024-01-24: 30 mg via INTRAVENOUS

## 2024-01-24 MED ORDER — MIDAZOLAM HCL (PF) 2 MG/2ML IJ SOLN
INTRAMUSCULAR | Status: DC | PRN
  Administered 2024-01-24: 2 mg via INTRAVENOUS

## 2024-01-24 MED ORDER — ORAL CARE MOUTH RINSE: 15.0000 mL | Freq: Once | OROMUCOSAL | Status: AC

## 2024-01-24 MED ORDER — ONDANSETRON HCL 4 MG PO TABS: 4.0000 mg | ORAL_TABLET | Freq: Every day | ORAL | 1 refills | Status: AC | PRN

## 2024-01-24 MED ORDER — CHLORHEXIDINE GLUCONATE 0.12 % MT SOLN
15.0000 mL | Freq: Once | OROMUCOSAL | Status: AC
  Administered 2024-01-24: 15 mL via OROMUCOSAL

## 2024-01-24 MED ORDER — ROCURONIUM BROMIDE 10 MG/ML (PF) SYRINGE
PREFILLED_SYRINGE | INTRAVENOUS | Status: DC | PRN
  Administered 2024-01-24: 10 mg via INTRAVENOUS
  Administered 2024-01-24: 60 mg via INTRAVENOUS

## 2024-01-24 MED ORDER — METHYLENE BLUE 20 MG/2ML IV SOSY
PREFILLED_SYRINGE | INTRAVENOUS | Status: AC
  Filled 2024-01-24: qty 2

## 2024-01-24 MED ORDER — ROCURONIUM BROMIDE 10 MG/ML (PF) SYRINGE
PREFILLED_SYRINGE | INTRAVENOUS | Status: AC
  Filled 2024-01-24: qty 10

## 2024-01-24 MED ORDER — CHLORHEXIDINE GLUCONATE 0.12 % MT SOLN
OROMUCOSAL | Status: AC
  Filled 2024-01-24: qty 15

## 2024-01-24 MED ORDER — CEFAZOLIN SODIUM-DEXTROSE 2-4 GM/100ML-% IV SOLN
INTRAVENOUS | Status: AC
  Filled 2024-01-24: qty 100

## 2024-01-24 MED ORDER — SODIUM CHLORIDE (PF) 0.9 % IJ SOLN
PREFILLED_SYRINGE | INTRAVENOUS | Status: DC | PRN
  Administered 2024-01-24: 25 mL via INTRAUTERINE

## 2024-01-24 MED ORDER — BUPIVACAINE HCL (PF) 0.25 % IJ SOLN
INTRAMUSCULAR | Status: AC
  Filled 2024-01-24: qty 30

## 2024-01-24 MED ORDER — 0.9 % SODIUM CHLORIDE (POUR BTL) OPTIME
TOPICAL | Status: DC | PRN
  Administered 2024-01-24: 1000 mL

## 2024-01-24 MED ORDER — SUGAMMADEX SODIUM 200 MG/2ML IV SOLN
INTRAVENOUS | Status: DC | PRN
  Administered 2024-01-24: 200 mg via INTRAVENOUS

## 2024-01-24 MED ORDER — ONDANSETRON HCL 4 MG/2ML IJ SOLN
INTRAMUSCULAR | Status: AC
Start: 2024-01-24 — End: 2024-01-24
  Filled 2024-01-24: qty 2

## 2024-01-24 MED ORDER — HYDROMORPHONE HCL 1 MG/ML IJ SOLN
INTRAMUSCULAR | Status: DC | PRN
  Administered 2024-01-24 (×2): .25 mg via INTRAVENOUS

## 2024-01-24 MED ORDER — ONDANSETRON HCL 4 MG/2ML IJ SOLN
INTRAMUSCULAR | Status: DC | PRN
  Administered 2024-01-24: 4 mg via INTRAVENOUS

## 2024-01-24 MED ORDER — PROPOFOL 10 MG/ML IV BOLUS
INTRAVENOUS | Status: DC | PRN
Start: 2024-01-24 — End: 2024-01-24
  Administered 2024-01-24: 150 mg via INTRAVENOUS
  Administered 2024-01-24: 50 mg via INTRAVENOUS

## 2024-01-24 MED ORDER — KETOROLAC TROMETHAMINE 30 MG/ML IJ SOLN
INTRAMUSCULAR | Status: AC
  Filled 2024-01-24: qty 1

## 2024-01-24 MED ORDER — SCOPOLAMINE 1 MG/3DAYS TD PT72
1.0000 | MEDICATED_PATCH | TRANSDERMAL | Status: DC
  Administered 2024-01-24: 1 mg via TRANSDERMAL

## 2024-01-24 SURGICAL SUPPLY — 39 items
CATH ROBINSON RED A/P 16FR (CATHETERS) IMPLANT
DEPRESSOR TONGUE 6 IN STERILE (GAUZE/BANDAGES/DRESSINGS) IMPLANT
DERMABOND ADVANCED .7 DNX12 (GAUZE/BANDAGES/DRESSINGS) ×3 IMPLANT
DRAPE SURG IRRIG POUCH 19X23 (DRAPES) ×3 IMPLANT
DRSG OPSITE POSTOP 3X4 (GAUZE/BANDAGES/DRESSINGS) IMPLANT
DURAPREP 26ML APPLICATOR (WOUND CARE) ×3 IMPLANT
ELECTRODE REM PT RTRN 9FT ADLT (ELECTROSURGICAL) ×3 IMPLANT
GLOVE BIO SURGEON STRL SZ8 (GLOVE) ×6 IMPLANT
GLOVE BIOGEL PI IND STRL 7.0 (GLOVE) ×6 IMPLANT
GLOVE BIOGEL PI IND STRL 8.5 (GLOVE) ×3 IMPLANT
GOWN STRL REUS W/ TWL LRG LVL3 (GOWN DISPOSABLE) ×6 IMPLANT
IRRIGATION SUCT STRKRFLW 2 WTP (MISCELLANEOUS) IMPLANT
KIT PINK PAD W/HEAD ARM REST (MISCELLANEOUS) ×3 IMPLANT
KIT TURNOVER KIT B (KITS) ×3 IMPLANT
MANIPULATOR UTERINE 4.5 ZUMI (MISCELLANEOUS) ×3 IMPLANT
NDL INSUFFLATION 14GA 120MM (NEEDLE) ×3 IMPLANT
NEEDLE INSUFFLATION 14GA 120MM (NEEDLE) ×2 IMPLANT
PACK LAPAROSCOPY BASIN (CUSTOM PROCEDURE TRAY) ×3 IMPLANT
POUCH LAPAROSCOPIC INSTRUMENT (MISCELLANEOUS) IMPLANT
SEPRAFILM MEMBRANE 5X6 (MISCELLANEOUS) IMPLANT
SET TUBE SMOKE EVAC HIGH FLOW (TUBING) ×3 IMPLANT
SHEARS 1100 HARMONIC 36 (ELECTROSURGICAL) IMPLANT
SHEARS HARMONIC 36 ACE (MISCELLANEOUS) IMPLANT
SLEEVE Z-THREAD 5X100MM (TROCAR) ×3 IMPLANT
SOLN 0.9% NACL POUR BTL 1000ML (IV SOLUTION) ×3 IMPLANT
SUT MNCRL AB 4-0 PS2 18 (SUTURE) ×3 IMPLANT
SUT PROLENE 5 0 RB 1 DA (SUTURE) IMPLANT
SUT VIC AB 2-0 CT1 TAPERPNT 27 (SUTURE) IMPLANT
SUT VIC AB 2-0 UR6 27 (SUTURE) IMPLANT
SYR 30ML LL (SYRINGE) ×3 IMPLANT
SYR 50ML LL SCALE MARK (SYRINGE) IMPLANT
SYR TOOMEY 50ML (SYRINGE) IMPLANT
SYSTEM BAG RETRIEVAL 10MM (BASKET) IMPLANT
TOWEL GREEN STERILE FF (TOWEL DISPOSABLE) ×6 IMPLANT
TRAY FOLEY W/BAG SLVR 14FR (SET/KITS/TRAYS/PACK) ×3 IMPLANT
TROCAR 11X100 Z THREAD (TROCAR) IMPLANT
TROCAR Z-THREAD BLADED 11X100M (TROCAR) IMPLANT
TROCAR Z-THREAD BLADED 5X100MM (TROCAR) ×3 IMPLANT
WARMER LAPAROSCOPE (MISCELLANEOUS) ×3 IMPLANT

## 2024-01-24 NOTE — Op Note (Signed)
 Operative Note  Preoperative diagnosis: Right hydrosalpinx, probable endometriosis, infertility  Postoperative diagnosis: Bilateral hydrosalpinx with complete distal occlusion of the right tube and distal phimosis of the left tube  Procedure: Laparoscopy, right salpingectomy, left fimbrioplasty, chromotubation  Surgeon: Cynthia Loss, MD  Anesthesia: Gen. endotracheal  Complications: None  Estimated blood loss: <10 cc  Specimens: Right fallopian tube to pathology  Findings: On exam under anesthesia, external genitalia, Bartholin's, Skene's, urethra and and vagina were normal.  The cervix appeared grossly normal.  Uterus was retroverted, mobile, and and normal-sized.  There was no posterior fornix nodularity palpable.  There were no adnexal masses palpable.  The uterus sounded to 9.5 cm. On laparoscopy, upper abdomen, liver surface and diaphragm surfaces were normal. Gallbladder was normal.  The appendix was not visualized. The pelvic peritoneum looked normal.  Right tube showed hydrosalpinx without any peritubal adhesions it was thin-walled and a few fimbrial tufts were visible.  It distended to 2 cm with both ovaries appeared normal as well as the ovarian fossae.  Left tube showed hydrosalpinx with a phimotic fimbria and no peritubal adhesions.  It had a thin wall..  It did pass the methylene blue from a small opening, with distention of the ampullary portion of the tube. Both ovaries appear grossly normal. There were no peritoneal lesions of endometriosis.  Description of the procedure: The patient was placed in dorsal supine position and general endotracheal anesthesia was given. 2 g of cefazolin were given intravenously for prophylaxis. Patient was placed in lithotomy position. She was prepped and draped inside manner.  A Foley catheter was inserted into the bladder. A ZUMI catheter was placed into the uterine cavity. The surgeon was regloved and a surgical field was created on the  abdomen.  After preemptive anesthesia of all surgical sites with 0.25% bupivacaine, a 5 mm intraumbilical skin incision was made and a Verress needle was inserted. Its correct location was confirmed. A pneumoperitoneum was created with carbon dioxide.  5 mm laparoscope with a 30 lens was inserted and video laparoscopy was started . A left lower quadrant 5 mm and a right lower quadrant  5 mm incisions were made and ancillary trochars were placed under direct visualization. Above findings were noted.   Using a needle electrode on 35 W of cutting current, the phimotic portion of the left distal tube was cut after stretching it over [place]  forceps, providing the distal tube with a wide opening to resulting tubal flaps were everted up to the ampullary serosa and sutured with 5-0 Prolene interrupted suture and using intracorporeal knot tying. Next we decided to perform a right total salpingectomy.  Harmonic Ace was applied to the right uterotubal junction and the tube was transected.  We then successive applications of the harmonic Ace on the mesosalpinx very close to the tubal wall, and coagulation resulted in total extirpation of the tube.  Hemostasis was ensured pelvis was irrigated and aspirated.  The gas was allowed to escape.  Instrument and lap pad count were correct x 2.  4-0 Monocryl subcuticular sutures were placed on the port sites.  The incisions were approximated with Dermabond.   The patient tolerated the procedure well and was transferred to recovery room in satisfactory condition.    Madilyne Tadlock, MD

## 2024-01-24 NOTE — H&P (Signed)
 Samantha Shea is a 33 y.o. female , originally referred to me by Dr. Lequita, for infertility and possible endo.  HSG suggested right hydrosalpinx.   Patient would like to preserve her childbearing potential.  Pertinent Gynecological History: Menses: flow is excessive with use of 3 pads or tampons on heaviest days Bleeding: dysfunctional uterine bleeding Contraception: none DES exposure: denies Blood transfusions: none Sexually transmitted diseases: no past history Previous GYN Procedures:   Last pap: normal  OB History:    Menstrual History: Menarche age: 70 No LMP recorded.    Past Medical History:  Diagnosis Date   Anxiety    Chicken pox    Class 3 severe obesity due to excess calories without serious comorbidity with body mass index (BMI) of 40.0 to 44.9 in adult [facility]) 10/10/2022   Concussion 11/28/2017   Frequent headaches    Migraines    PCOS (polycystic ovarian syndrome)                     Past Surgical History:  Procedure Laterality Date   gum graft  09/2018 01/2019   KNEE SURGERY Bilateral    arthroscopic, age 21             Family History  Problem Relation Age of Onset   Asthma Mother    Early death Mother    COPD Mother    Heart disease Mother    Miscarriages / Stillbirths Mother    Alcohol abuse Father    Drug abuse Father    Hypertension Father    Miscarriages / Stillbirths Sister    Cancer Maternal Grandmother    Heart attack Maternal Grandfather    Stroke Paternal Grandmother    Heart disease Paternal Grandfather    Miscarriages / Stillbirths Sister    No hereditary disease.  No cancer of breast, ovary, uterus.    Social History   Socioeconomic History   Marital status: Married    Spouse name: Not on file   Number of children: 0   Years of education: Not on file   Highest education level: Bachelor's degree (e.g., BA, AB, BS)  Occupational History   Occupation: Tourist Information Centre Manager: [employer]  Tobacco Use    Smoking status: Never   Smokeless tobacco: Never  Vaping Use   Vaping status: Never Used  Substance and Sexual Activity   Alcohol use: Yes    Comment: occasional   Drug use: Yes    Types: Marijuana    Comment: Occ for anxiety, last 01/23/24   Sexual activity: Yes  Other Topics Concern   Not on file  Social History Narrative   Not on file   Social Drivers of Health   Financial Resource Strain: Medium Risk (10/11/2023)   Overall Financial Resource Strain (CARDIA)    Difficulty of Paying Living Expenses: Somewhat hard  Food Insecurity: No Food Insecurity (10/11/2023)   Hunger Vital Sign    Worried About Running Out of Food in the Last Year: Never true    Ran Out of Food in the Last Year: Never true  Transportation Needs: No Transportation Needs (10/11/2023)   PRAPARE - Administrator, Civil Service (Medical): No    Lack of Transportation (Non-Medical): No  Physical Activity: Insufficiently Active (10/11/2023)   Exercise Vital Sign    Days of Exercise per Week: 3 days    Minutes of Exercise per Session: 20 min  Stress: Stress Concern Present (10/11/2023)   [place]  [employer] - Occupational Stress Questionnaire    Feeling of Stress: To some extent  Social Connections: Moderately Integrated (10/11/2023)   Social Connection and Isolation Panel    Frequency of Communication with Friends and Family: Once a week    Frequency of Social Gatherings with Friends and Family: Once a week    Attends Religious Services: More than 4 times per year    Active Member of [employer] or Organizations: Yes    Attends Engineer, Structural: More than 4 times per year    Marital Status: Married  Catering Manager Violence: Not on file    No Known Allergies  No current facility-administered medications on file prior to encounter.   Current Outpatient Medications on File Prior to Encounter  Medication Sig Dispense Refill   Prenatal Vit-Fe Fumarate-FA (PRENATAL  MULTIVITAMIN) TABS tablet Take 1 tablet by mouth daily at 12 noon.     Vitamin D -Vitamin K (K2 PLUS D3 PO) Take by mouth. 3xwk     cholecalciferol (VITAMIN D ) 1000 units tablet Take 2,000 Units by mouth daily. (Patient not taking: Reported on 01/22/2024)     fluticasone  (FLONASE ) 50 MCG/ACT nasal spray Place 2 sprays into both nostrils daily. (Patient not taking: Reported on 01/22/2024) 15.8 mL 5     Review of Systems  Constitutional: Negative.   HENT: Negative.   Eyes: Negative.   Respiratory: Negative.   Cardiovascular: Negative.   Gastrointestinal: Negative.   Genitourinary: Negative.   Musculoskeletal: Negative.   Skin: Negative.   Neurological: Negative.   Endo/Heme/Allergies: Negative.   Psychiatric/Behavioral: Negative.      Physical Exam  BP (!) 140/95   Pulse 67   Temp 98.3 F (36.8 C) (Oral)   Resp 16   Ht 5' 5 (1.651 m)   Wt 99.3 kg   LMP 12/30/2023 (Exact Date)   SpO2 98%   BMI 36.44 kg/m  Constitutional: She is oriented to person, place, and time. She appears well-developed and well-nourished.  HENT:  Head: Normocephalic and atraumatic.  Nose: Nose normal.  Mouth/Throat: Oropharynx is clear and moist. No oropharyngeal exudate.  Eyes: Conjunctivae normal and EOM are normal. Pupils are equal, round, and reactive to light. No scleral icterus.  Neck: Normal range of motion. Neck supple. No tracheal deviation present. No thyromegaly present.  Cardiovascular: Normal rate.   Respiratory: Effort normal and breath sounds normal.  GI: Soft. Bowel sounds are normal. She exhibits no distension and no mass. There is no tenderness.  Lymphadenopathy:    She has no cervical adenopathy.  Neurological: She is alert and oriented to person, place, and time. She has normal reflexes.  Skin: Skin is warm.  Psychiatric: She has a normal mood and affect. Her behavior is normal. Judgment and thought content normal.    Assessment/Plan:  Right hydrosalpinx with possible  endometriosis, causing infertility and pelvic pain Preoperative for laparoscopy, possible salpingectomy, excision of any endometriosis Benefits and risks of the proposed procedures were discussed with the patient and her family member again.  Bowel prep instructions were given.  All of patient's questions were answered.  She verbalized understanding.   Janssen Zee, MD

## 2024-01-24 NOTE — Anesthesia Preprocedure Evaluation (Addendum)
 Anesthesia Evaluation  Patient identified by MRN, date of birth, ID band Patient awake    Reviewed: Allergy & Precautions, NPO status , Patient's Chart, lab work & pertinent test results  History of Anesthesia Complications Negative for: history of anesthetic complications  Airway Mallampati: II  TM Distance: >3 FB Neck ROM: Full    Dental  (+) Teeth Intact, Dental Advisory Given   Pulmonary Patient abstained from smoking.   breath sounds clear to auscultation       Cardiovascular  Rhythm:Regular Rate:Normal     Neuro/Psych  Headaches    GI/Hepatic negative GI ROS, Neg liver ROS,,,  Endo/Other    Renal/GU negative Renal ROS     Musculoskeletal   Abdominal   Peds  Hematology   Anesthesia Other Findings   Reproductive/Obstetrics                              Anesthesia Physical Anesthesia Plan  ASA: 1  Anesthesia Plan: General   Post-op Pain Management:    Induction: Intravenous  PONV Risk Score and Plan: 3 and Ondansetron , Dexamethasone, Midazolam, Treatment may vary due to age or medical condition and Scopolamine patch - Pre-op  Airway Management Planned: Oral ETT  Additional Equipment:   Intra-op Plan:   Post-operative Plan: Extubation in OR  Informed Consent:      Dental advisory given  Plan Discussed with: CRNA  Anesthesia Plan Comments:          Anesthesia Quick Evaluation

## 2024-01-24 NOTE — Anesthesia Procedure Notes (Signed)
 Procedure Name: Intubation Date/Time: 01/24/2024 2:05 PM  Performed by: Carlo Lorson C, CRNAPre-anesthesia Checklist: Patient identified, Emergency Drugs available, Suction available and Patient being monitored Patient Re-evaluated:Patient Re-evaluated prior to induction Oxygen Delivery Method: Circle system utilized Preoxygenation: Pre-oxygenation with 100% oxygen Induction Type: IV induction Ventilation: Mask ventilation without difficulty Laryngoscope Size: Mac and 3 Grade View: Grade I Tube type: Oral Number of attempts: 1 Airway Equipment and Method: Stylet and Oral airway Placement Confirmation: ETT inserted through vocal cords under direct vision, positive ETCO2 and breath sounds checked- equal and bilateral Secured at: 21 cm Tube secured with: Tape Dental Injury: Teeth and Oropharynx as per pre-operative assessment

## 2024-01-24 NOTE — Transfer of Care (Signed)
 Immediate Anesthesia Transfer of Care Note  Patient: Samantha Shea  Procedure(s) Performed: OPERATIVE Laparoscopy SALPINGECTOMY, UNILATERAL, LAPAROSCOPIC, CHROMATUBATION, LEFT FEMBROPLASTY (Right)  Patient Location: PACU  Anesthesia Type:General  Level of Consciousness: awake, alert , and oriented  Airway & Oxygen Therapy: Patient Spontanous Breathing and Patient connected to face mask oxygen  Post-op Assessment: Report given to RN and Post -op Vital signs reviewed and stable  Post vital signs: Reviewed and stable  Last Vitals:  Vitals Value Taken Time  BP 136/81 01/24/24 15:45  Temp    Pulse 73 01/24/24 15:47  Resp 15 01/24/24 15:47  SpO2 100 % 01/24/24 15:47  Vitals shown include unfiled device data.  Last Pain:  Vitals:   01/24/24 1036  TempSrc: Oral  PainSc: 0-No pain      Patients Stated Pain Goal: 5 (01/24/24 1036)  Complications: No notable events documented.

## 2024-01-25 ENCOUNTER — Encounter (HOSPITAL_COMMUNITY): Payer: Self-pay | Admitting: Obstetrics and Gynecology

## 2024-01-25 NOTE — Anesthesia Postprocedure Evaluation (Signed)
 Anesthesia Post Note  Patient: Samantha Shea  Procedure(s) Performed: OPERATIVE Laparoscopy SALPINGECTOMY, UNILATERAL, LAPAROSCOPIC, CHROMATUBATION, LEFT FEMBROPLASTY (Right)     Patient location during evaluation: PACU Anesthesia Type: General Level of consciousness: awake and alert Pain management: pain level controlled Vital Signs Assessment: post-procedure vital signs reviewed and stable Respiratory status: spontaneous breathing, nonlabored ventilation and respiratory function stable Cardiovascular status: stable and blood pressure returned to baseline Anesthetic complications: no   No notable events documented.  Last Vitals:  Vitals:   01/24/24 1615 01/24/24 1630  BP: 128/78 124/89  Pulse: 69 79  Resp: 14 17  Temp:    SpO2: 100% 97%    Last Pain:  Vitals:   01/24/24 1615  TempSrc:   PainSc: 0-No pain                 Debby FORBES Like

## 2024-01-26 LAB — SURGICAL PATHOLOGY

## 2024-10-11 ENCOUNTER — Encounter: Admitting: Family
